# Patient Record
Sex: Female | Born: 1965 | Race: White | Hispanic: No | Marital: Married | Smoking: Never smoker
Health system: Southern US, Community
[De-identification: ages and names within clinical notes are randomized; demographics above are authoritative.]

## PROBLEM LIST (undated history)

## (undated) DIAGNOSIS — E7212 Methylenetetrahydrofolate reductase deficiency: Secondary | ICD-10-CM

## (undated) DIAGNOSIS — R011 Cardiac murmur, unspecified: Secondary | ICD-10-CM

## (undated) DIAGNOSIS — R112 Nausea with vomiting, unspecified: Secondary | ICD-10-CM

## (undated) DIAGNOSIS — K219 Gastro-esophageal reflux disease without esophagitis: Secondary | ICD-10-CM

## (undated) DIAGNOSIS — Z9889 Other specified postprocedural states: Secondary | ICD-10-CM

## (undated) DIAGNOSIS — I6529 Occlusion and stenosis of unspecified carotid artery: Secondary | ICD-10-CM

## (undated) DIAGNOSIS — G7102 Facioscapulohumeral muscular dystrophy: Secondary | ICD-10-CM

## (undated) DIAGNOSIS — F329 Major depressive disorder, single episode, unspecified: Secondary | ICD-10-CM

## (undated) DIAGNOSIS — G43909 Migraine, unspecified, not intractable, without status migrainosus: Secondary | ICD-10-CM

## (undated) DIAGNOSIS — Z86718 Personal history of other venous thrombosis and embolism: Secondary | ICD-10-CM

## (undated) DIAGNOSIS — F32A Depression, unspecified: Secondary | ICD-10-CM

## (undated) HISTORY — DX: Facioscapulohumeral muscular dystrophy: G71.02

## (undated) HISTORY — PX: FOOT SURGERY: SHX648

## (undated) HISTORY — DX: Cardiac murmur, unspecified: R01.1

## (undated) HISTORY — PX: ABDOMINAL HYSTERECTOMY: SHX81

## (undated) HISTORY — DX: Occlusion and stenosis of unspecified carotid artery: I65.29

## (undated) HISTORY — PX: WRIST SURGERY: SHX841

## (undated) HISTORY — PX: BACK SURGERY: SHX140

## (undated) HISTORY — DX: Methylenetetrahydrofolate reductase deficiency: E72.12

---

## 2000-01-02 ENCOUNTER — Other Ambulatory Visit: Admission: RE | Admit: 2000-01-02 | Discharge: 2000-01-02 | Payer: Self-pay | Admitting: Family Medicine

## 2000-01-25 ENCOUNTER — Encounter: Payer: Self-pay | Admitting: Family Medicine

## 2000-01-25 ENCOUNTER — Encounter: Admission: RE | Admit: 2000-01-25 | Discharge: 2000-01-25 | Payer: Self-pay | Admitting: Family Medicine

## 2000-07-27 ENCOUNTER — Ambulatory Visit (HOSPITAL_BASED_OUTPATIENT_CLINIC_OR_DEPARTMENT_OTHER): Admission: RE | Admit: 2000-07-27 | Discharge: 2000-07-27 | Payer: Self-pay | Admitting: Orthopedic Surgery

## 2001-02-04 ENCOUNTER — Other Ambulatory Visit: Admission: RE | Admit: 2001-02-04 | Discharge: 2001-02-04 | Payer: Self-pay | Admitting: Family Medicine

## 2001-02-11 ENCOUNTER — Encounter: Admission: RE | Admit: 2001-02-11 | Discharge: 2001-02-11 | Payer: Self-pay | Admitting: Family Medicine

## 2001-02-11 ENCOUNTER — Encounter: Payer: Self-pay | Admitting: Family Medicine

## 2002-05-20 ENCOUNTER — Other Ambulatory Visit: Admission: RE | Admit: 2002-05-20 | Discharge: 2002-05-20 | Payer: Self-pay | Admitting: Family Medicine

## 2002-06-17 ENCOUNTER — Encounter: Payer: Self-pay | Admitting: Family Medicine

## 2002-06-17 ENCOUNTER — Encounter: Admission: RE | Admit: 2002-06-17 | Discharge: 2002-06-17 | Payer: Self-pay | Admitting: Family Medicine

## 2002-10-20 ENCOUNTER — Observation Stay (HOSPITAL_COMMUNITY): Admission: RE | Admit: 2002-10-20 | Discharge: 2002-10-21 | Payer: Self-pay | Admitting: Obstetrics and Gynecology

## 2002-10-20 ENCOUNTER — Encounter (INDEPENDENT_AMBULATORY_CARE_PROVIDER_SITE_OTHER): Payer: Self-pay | Admitting: *Deleted

## 2004-07-11 ENCOUNTER — Other Ambulatory Visit: Admission: RE | Admit: 2004-07-11 | Discharge: 2004-07-11 | Payer: Self-pay | Admitting: Obstetrics and Gynecology

## 2005-05-02 ENCOUNTER — Ambulatory Visit (HOSPITAL_COMMUNITY): Admission: RE | Admit: 2005-05-02 | Discharge: 2005-05-02 | Payer: Self-pay | Admitting: Gastroenterology

## 2006-05-22 ENCOUNTER — Encounter: Admission: RE | Admit: 2006-05-22 | Discharge: 2006-08-20 | Payer: Self-pay | Admitting: Psychology

## 2008-06-06 ENCOUNTER — Inpatient Hospital Stay (HOSPITAL_COMMUNITY): Admission: EM | Admit: 2008-06-06 | Discharge: 2008-06-07 | Payer: Self-pay | Admitting: Emergency Medicine

## 2008-06-06 ENCOUNTER — Encounter: Payer: Self-pay | Admitting: Surgery

## 2008-06-06 ENCOUNTER — Ambulatory Visit: Payer: Self-pay | Admitting: Surgery

## 2008-06-22 ENCOUNTER — Ambulatory Visit: Payer: Self-pay | Admitting: Surgery

## 2009-01-18 ENCOUNTER — Ambulatory Visit: Payer: Self-pay | Admitting: Surgery

## 2009-01-20 ENCOUNTER — Ambulatory Visit: Payer: Self-pay | Admitting: Oncology

## 2009-01-26 LAB — CBC WITH DIFFERENTIAL (CANCER CENTER ONLY)
BASO#: 0 10*3/uL (ref 0.0–0.2)
Eosinophils Absolute: 0.2 10*3/uL (ref 0.0–0.5)
HGB: 12.8 g/dL (ref 11.6–15.9)
LYMPH#: 1.1 10*3/uL (ref 0.9–3.3)
NEUT#: 2 10*3/uL (ref 1.5–6.5)
Platelets: 294 10*3/uL (ref 145–400)
RBC: 3.98 10*6/uL (ref 3.70–5.32)
WBC: 3.6 10*3/uL — ABNORMAL LOW (ref 3.9–10.0)

## 2009-01-26 LAB — CMP (CANCER CENTER ONLY)
CO2: 31 mEq/L (ref 18–33)
Calcium: 9.2 mg/dL (ref 8.0–10.3)
Glucose, Bld: 81 mg/dL (ref 73–118)
Sodium: 140 mEq/L (ref 128–145)
Total Bilirubin: 0.3 mg/dl (ref 0.20–1.60)
Total Protein: 7.9 g/dL (ref 6.4–8.1)

## 2009-01-29 LAB — HYPERCOAGULABLE PANEL, COMPREHENSIVE RET.
AntiThromb III Func: 108 % (ref 76–126)
Anticardiolipin IgG: 4 GPL U/mL (ref <10)
Beta-2 Glyco I IgG: 6 U/mL (ref <=15)
Beta-2-Glycoprotein I IgA: <3 U/mL (ref <=15)
Beta-2-Glycoprotein I IgM: 10 U/mL (ref <=15)
DRVVT: 54.5 secs — ABNORMAL HIGH (ref 34.7–40.5)
Homocysteine: 6.9 umol/L (ref 4.0–15.4)
PTT Lupus Anticoagulant: 53.9 secs — ABNORMAL HIGH (ref 32.0–43.4)
Protein S Ag, Total: 62 % — ABNORMAL LOW (ref 70–140)

## 2009-02-18 ENCOUNTER — Ambulatory Visit: Payer: Self-pay | Admitting: Oncology

## 2009-02-23 LAB — PROTIME-INR (CHCC SATELLITE): INR: 2 (ref 2.0–3.5)

## 2009-03-31 ENCOUNTER — Ambulatory Visit: Payer: Self-pay | Admitting: Oncology

## 2009-04-08 LAB — PROTEIN C, TOTAL: Protein C, Total: 119 % (ref 70–140)

## 2009-04-08 LAB — PROTEIN C ACTIVITY: Protein C Activity: 200 % — ABNORMAL HIGH (ref 75–133)

## 2009-07-09 ENCOUNTER — Ambulatory Visit: Payer: Self-pay | Admitting: Oncology

## 2009-07-17 LAB — HYPERCOAGULABLE PANEL, COMPREHENSIVE
Anticardiolipin IgA: 0 APL U/mL (ref <22)
Anticardiolipin IgM: 0 MPL U/mL (ref <11)
Beta-2-Glycoprotein I IgM: 6 M Units (ref <20)
DRVVT: 39.9 secs (ref 36.2–44.3)
Protein C Activity: 178 % — ABNORMAL HIGH (ref 75–133)
Protein C, Total: 133 % (ref 70–140)
Protein S Activity: 76 % (ref 69–129)

## 2009-07-20 ENCOUNTER — Ambulatory Visit: Payer: Self-pay | Admitting: Vascular Surgery

## 2010-03-28 ENCOUNTER — Encounter: Admission: RE | Admit: 2010-03-28 | Discharge: 2010-03-28 | Payer: Self-pay | Admitting: Advanced Practice Midwife

## 2010-08-09 LAB — BASIC METABOLIC PANEL
BUN: 8 mg/dL (ref 6–23)
CO2: 27 mEq/L (ref 19–32)
Calcium: 8.4 mg/dL (ref 8.4–10.5)
Chloride: 95 mEq/L — ABNORMAL LOW (ref 96–112)
Creatinine, Ser: 0.81 mg/dL (ref 0.4–1.2)
GFR calc Af Amer: >60 mL/min (ref >60)
GFR calc non Af Amer: >60 mL/min (ref >60)
GFR calc non Af Amer: >60 mL/min (ref >60)
Glucose, Bld: 82 mg/dL (ref 70–99)
Potassium: 4 mEq/L (ref 3.5–5.1)
Potassium: 4.2 mEq/L (ref 3.5–5.1)
Sodium: 128 mEq/L — ABNORMAL LOW (ref 135–145)
Sodium: 134 mEq/L — ABNORMAL LOW (ref 135–145)

## 2010-08-09 LAB — POCT I-STAT, CHEM 8
Creatinine, Ser: 0.9 mg/dL (ref 0.4–1.2)
HCT: 39 % (ref 36.0–46.0)
Hemoglobin: 13.3 g/dL (ref 12.0–15.0)
Potassium: 3.9 mEq/L (ref 3.5–5.1)
Sodium: 131 mEq/L — ABNORMAL LOW (ref 135–145)

## 2010-08-09 LAB — APTT: aPTT: 29 seconds (ref 24–37)

## 2010-08-09 LAB — DIFFERENTIAL
Basophils Absolute: 0 10*3/uL (ref 0.0–0.1)
Basophils Relative: 0 % (ref 0–1)
Eosinophils Absolute: 0.2 10*3/uL (ref 0.0–0.7)
Eosinophils Relative: 5 % (ref 0–5)
Lymphocytes Relative: 28 % (ref 12–46)
Lymphs Abs: 1.5 10*3/uL (ref 0.7–4.0)
Monocytes Absolute: 0.4 10*3/uL (ref 0.1–1.0)
Monocytes Relative: 8 % (ref 3–12)
Neutro Abs: 3.3 10*3/uL (ref 1.7–7.7)
Neutrophils Relative %: 60 % (ref 43–77)

## 2010-08-09 LAB — LIPID PANEL
Cholesterol: 133 mg/dL (ref 0–200)
HDL: 34 mg/dL — ABNORMAL LOW (ref >39)
HDL: 38 mg/dL — ABNORMAL LOW (ref >39)
LDL Cholesterol: 88 mg/dL (ref 0–99)
Triglycerides: 37 mg/dL (ref <150)

## 2010-08-09 LAB — CBC
HCT: 35.2 % — ABNORMAL LOW (ref 36.0–46.0)
Hemoglobin: 12.1 g/dL (ref 12.0–15.0)
MCHC: 34.4 g/dL (ref 30.0–36.0)
MCHC: 34.4 g/dL (ref 30.0–36.0)
MCV: 96.7 fL (ref 78.0–100.0)
MCV: 97.2 fL (ref 78.0–100.0)
Platelets: 282 10*3/uL (ref 150–400)
Platelets: 324 10*3/uL (ref 150–400)
RBC: 3.62 MIL/uL — ABNORMAL LOW (ref 3.87–5.11)
RDW: 13.2 % (ref 11.5–15.5)
WBC: 5.6 10*3/uL (ref 4.0–10.5)

## 2010-08-09 LAB — HEPARIN LEVEL (UNFRACTIONATED): Heparin Unfractionated: 0.22 IU/mL — ABNORMAL LOW (ref 0.30–0.70)

## 2010-08-09 LAB — PROTIME-INR
INR: 1 (ref 0.00–1.49)
Prothrombin Time: 13.7 seconds (ref 11.6–15.2)

## 2010-09-06 NOTE — Assessment & Plan Note (Signed)
OFFICE VISIT   JAHNI, PAUL  DOB:  Mar 29, 1966                                       07/20/2009  NFAOZ#:[id number]   The patient returns to clinic today for evaluation of a right thumb  embolism.  She was last seen by Dr. Myra Gianotti 01/18/2009.  At that time,  she was doing remarkably well and her thumb had improved and was back to  baseline.  She returns to clinic today complaining of some right upper  extremity numbness and prickling sensation on her face.  She also stated  that her Coumadin had been stopped in December with a decline in her  condition, returning more towards baseline of forgetfulness and  tiredness, memory loss and numbness.  She is attributing this to the  discontinuation of the Coumadin and we are asked to comment on this.   PAST MEDICAL HISTORY:  Significant for an extensive history of  migraines.   REVIEW OF SYSTEMS:  Except for the aforementioned history of worsening  of memory problems, numbness in the right upper extremity and prickly  sensation on face and tiredness, was entirely negative.   PHYSICAL FINDINGS:  Blood pressure was 126/79, heart rate 72, blood  pressure on the left was 137/80.  She appeared to be well-nourished and  in no distress.  She did appear somewhat anxious.  HEENT:  PERRLA, EOMI.  Mucous membranes were pink and moist.  Cardiac exam revealed a regular  rate and rhythm.  The abdomen was soft, nontender.  There were no major  deformities of her musculoskeletal system.  I did evaluate the right  thumb by Doppler.  Her thenar pulse was present.   Due to her complaint of numbness in her right upper extremity, I did  elect to obtain a carotid ultrasound which was significant for a 1%-39%  narrowing of her left internal carotid artery.   ASSESSMENT/PLAN:  The patient today seems to be more desirous of  reinitiating Coumadin as she sees this as a precipitating event for the  worsening of her memory loss and  symptomatology.  She has seen Dr. Clelia Croft  and also Dr. Welton Flakes.  Dr. Park Breed has been in contact with Dr. Myra Gianotti  concerning this.  I did speak with Dr. Myra Gianotti by phone, and he is in  agreement that since she her symptoms did remarkably improve  and her  quality of life has improved on Coumadin, that the it would be feasible  to resume Coumadin at a low dose.   The patient does understand the long-term effects, risks and benefits of  Coumadin therapy.  With this in mind, we would have no problem with the  reinstitution of Coumadin therapy and long-term continuation provided  there was a benefit to the patient.   Wilmon Arms, PA   Janetta Hora. Fields, MD  Electronically Signed   KEL/MEDQ  D:  07/20/2009  T:  07/21/2009  Job:  [id number]   cc:   Lupita Raider, M.D.  Dr. Welton Flakes

## 2010-09-06 NOTE — Assessment & Plan Note (Signed)
OFFICE VISIT   Julie Stone, Julie Stone  DOB:  09-Nov-1965                                       06/22/2008  PPIRJ#:[id number]   REASON FOR VISIT:  Follow up.   HISTORY:  This a 45 year old female who presented to the emergency  department on June 06, 2008, with a blue right thumb.  This was not  associated with any trauma.  She does not have atrial fibrillation.  Her  only medical history is that of migraine headaches with aura.  Her aura  consisted of stroke-like symptoms and passing out.  I admitted the  patient over night and placed her on heparin.  She had undergone a CT  angiogram which revealed no etiology for her embolic process.  She also  underwent an arterial ultrasound which had digital pressures consistent  with embolic disease.  She was placed on heparin overnight and she did  improve.  Coloring came back into her finger.  For that reason, I  elected to discharge her home on Coumadin.  Dr. Arvilla Market is graciously  following her Coumadin levels.  She states today that her finger has  continued to improve.  The discoloration has nearly resolved and she has  normal function of her thumb.   PHYSICAL EXAMINATION:  Vital Signs:  Blood pressure 118/74, pulse is 83.  She is well-appearing, in no distress.  Cardiovascular:  Regular rate  and rhythm, respirations are nonlabored.  She has a palpable pulse in  her right arm, the thumb is nearly of normal color.   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   Plan:  Given that the patient had such a dramatic response from heparin  and Coumadin, I would recommend continuing this for at least 6 months.  She has undergone a rather extensive workup.  A CT angiogram was  negative.  Cardiac echocardiogram was negative.  I think that after 6  months of anticoagulation we can safely stop this and then perform a  hypercoagulable blood panel and make recommendations for long-term  anticoagulation based on  that.  Hopefully we can get away without long-  term coagulation.  She has also had a cholesterol panel checked, her LDL  was 83.  She had been on estrogen and this has subsequently been  stopped.  I will see her back in 6 months.  She will call if she has any  further questions.   Jorge Ny, MD  Electronically Signed   VWB/MEDQ  D:  06/22/2008  T:  06/23/2008  Job:  [id number]   cc:   Donia Guiles, M.D.

## 2010-09-06 NOTE — Assessment & Plan Note (Signed)
OFFICE VISIT   DRESDEN, AMENT  DOB:  1965/07/13                                       01/18/2009  OACZY#:[id number]   REASON FOR VISIT:  Follow up, right thumb embolism.   HISTORY:  This is a 45 year old female that I initially saw in February,  2010 in the emergency department, where she had presented with a painful  right blue thumb.  This was not associated with trauma.  She did not  have an irregular heartbeat.  She only had a history of migraines with  aura, which consisted of stroke-like symptoms and syncope.  A CT scan  was performed, which did not show any etiology for her embolic process.  Arterial ultrasound was performed which revealed digital pressures  consistent with embolic disease.  She had an echocardiogram, which has  been negative.  She was placed on heparin overnight in the hospital and  had significant improvement; therefore, I discharged her on Coumadin.  She has been on Coumadin since that time.  She has had a dramatic  improvement.  She no longer complains of any pain in her thumb.   PHYSICAL EXAMINATION:  Her blood pressure is 149/89.  Pulse is 69.  She  is well-appearing in no distress.  She has a palpable radial pulse.  She  has normal function and coloration of her right thumb.   ASSESSMENT:  Right thumb embolism.   PLAN:  The patient's extensive workup has been negative to date.  I had  initially planned to stop her Coumadin at this point time; however, she  has had such a dramatic improvement in her quality of life for the past  6 months, I am a little reluctant to do that.  For that reason, I am  going to send her to hematology, where she can get a full workup and  have them assist with the decision for duration of Coumadin therapy.  I  will plan on seeing her back in 6 months.   Jorge Ny, MD  Electronically Signed   VWB/MEDQ  D:  01/18/2009  T:  01/19/2009  Job:  [id number]

## 2010-09-06 NOTE — Procedures (Signed)
CAROTID DUPLEX EXAM   INDICATION:  Right upper extremity numbness, prickling sensation on  face.   HISTORY:  Diabetes:  No.  Cardiac:  No.  Hypertension:  No.  Smoking:  No.  Previous Surgery:  No.  CV History:  Complaint of tiredness, memory loss, numbness in the right  upper extremity, and prickling facial sensations.  Amaurosis Fugax No, Paresthesias No, Hemiparesis No.                                       RIGHT             LEFT  Brachial systolic pressure:         126               137  Brachial Doppler waveforms:         Normal            Normal  Vertebral direction of flow:        Antegrade         Antegrade  DUPLEX VELOCITIES (cm/sec)  CCA peak systolic                   75                75  ECA peak systolic                   78                92  ICA peak systolic                   110               75  ICA end diastolic                   49                32  PLAQUE MORPHOLOGY:                                    Homogenous  PLAQUE AMOUNT:                      None              Minimal  PLAQUE LOCATION:                                      Proximal ICA   IMPRESSION:  1. No evidence of right internal carotid artery stenosis.  2. 1-39% stenosis of the left internal carotid artery.   ___________________________________________  V. Charlena Cross, MD   CH/MEDQ  D:  07/21/2009  T:  07/21/2009  Job:  [id number]

## 2010-09-09 NOTE — Op Note (Signed)
Julie Stone, Julie Stone NO.:  [id number]   MEDICAL RECORD NO.:  [id number]          PATIENT TYPE:  AMB   LOCATION:  ENDO                         FACILITY:  [facility]   PHYSICIAN:  Julie Stone, M.D.   DATE OF BIRTH:  Feb 12, 1966   DATE OF PROCEDURE:  05/02/2004  DATE OF DISCHARGE:                                 OPERATIVE REPORT   REFERRING PHYSICIAN:  Dr. Donia Stone   PROCEDURE:  Esophagogastroduodenoscopy.   PROCEDURE INDICATION:  Ms. Julie Stone is a 45 year old female, born  [birth year].  Ms. Julie Stone has chronic intermittent solid food dysphagia,  unassociated with odynophagia or chronic heartburn.  She reports no weight  loss.   MEDICATION ALLERGIES:  DARVON and SULFA.   CHRONIC MEDICATIONS:  Lexapro, Keppra, Maxalt, Celebrex, Reglan, Estratest,  Toradol, Thorazine, Lamictal, Relpax, Vivactil, Prilosec, Phenergan,  Sudafed, ketoprofen, p.r.n. minocycline.   PAST MEDICAL HISTORY:  1.  TAH/BSO.  2.  Wrist surgery.  3.  Migraine headaches.  4.  Depression.  5.  Hypertension.  6.  Hyperlipidemia.   HABITS:  Ms. Julie Stone does not use tobacco products and consumes alcohol in  moderation.   FAMILY HISTORY:  Negative for colon cancer.   ENDOSCOPIST:  Dr. Reece Stone   PREMEDICATION:  1.  Versed 7 mg.  2.  Demerol 50 mg.   PROCEDURE:  After obtaining informed consent, Julie Stone was placed in the  left lateral decubitus position.  I administered intravenous Demerol and  intravenous Versed to achieve conscious sedation for the procedure.  The  patient's blood pressure, oxygen saturation, and cardiac rhythm were  monitored throughout the procedure and documented in the medical record.   The Olympus gastroscope was passed through the posterior hypopharynx in the  proximal esophagus without difficulty.  The hypopharynx, larynx, and vocal  cords appeared normal.   Esophagoscopy:  The proximal mid and lower segments of the esophageal mucosa  appear  completely normal.  The squamocolumnar junction and esophagogastric  junction are noted at 35 cm from the incisor teeth.  There is no endoscopic  evidence for the presence of esophageal stricture formation, Barrett's  esophagus, erosive esophagitis, or esophageal obstruction.   Gastroscopy:  Retroflexed view of the gastric cardia and fundus was normal.  The diaphragmatic hiatus is patulous.  The gastric body, antrum, and pylorus  appeared normal.   Duodenoscopy:  The duodenal bulb, second portion of duodenum, and third  portion of duodenum appeared normal.   ASSESSMENT:  Normal esophagogastroduodenoscopy.   RECOMMENDATIONS:  Continue Prilosec to prevent gastroesophageal reflux.  Use  p.r.n. sublingual hyoscyamine at the onset of dysphagia.           ______________________________  Julie Stone, M.D.     MJ/MEDQ  D:  05/02/2005  T:  05/02/2005  Job:  [id number]   cc:   Julie Stone, M.D.  Fax: [phone]

## 2010-09-09 NOTE — H&P (Signed)
NAME:  Julie Stone, Julie Stone                       ACCOUNT NO.:  [id number]   MEDICAL RECORD NO.:  [id number]                   PATIENT TYPE:  AMB   LOCATION:  SDC                                  FACILITY:  [facility]   PHYSICIAN:  Duke Salvia. Marcelle Overlie, M.D.            DATE OF BIRTH:  29-Sep-1965   DATE OF ADMISSION:  DATE OF DISCHARGE:                                HISTORY & PHYSICAL   HISTORY AND PHYSICAL:  Scheduled for surgery, October 20, 2002, [facility].   CHIEF COMPLAINT:  Slightly elevated CA125, endometrial polyp.   HISTORY AND PHYSICAL EXAM:  A 45 year old G3, P2.  Her husband has had a  vasectomy.  Till recently I had last seen her in 1999.  From 1999 until 2004  she has been under the care of several neurologists for problematic and  debilitating migraine headache.  The headache tends to be cyclic in nature  and she has been on a number of Zomig type medications and also trials of  estrogen manipulation without much improvement in her headache.  Additionally she has had some pelvic discomfort and heavier periods and was  worked up by Dr. Arvilla Market who obtained a CA125 that was slightly elevated at  34 and a thickened endometrium 1.2 cm suggestive of a polyp.  We performed  __________ in our office that showed a uterus 8 x 4.8 x 4.2.  Endometrium  was 14 mm.  Both ovaries were normal.  There was no free fluid.  There was a  definite well formed endometrial polyp within the cavity.  Discussed the  number of options including D&C, hysteroscopy or more definitive evaluation.  She is concerned about the elevation of her CA125 and states that her mother  went through early menopause and her preference especially given the  possible hormonal trigger between estrogen and her severe headache would  prefer LAVH-BSO.  This procedure including risk of bleeding, infection,  transfusion, adjacent organ injury, the possible need to complete surgery  abdominally were all reviewed.   Additionally, the need for some form of ERT  to help with vasomotor symptoms, to prevent osteoporosis etcetera at her age  were all discussed which she understands and accepts.  Her expected recovery  time, the risk regarding phlebitis, wound infection reviewed with her which  she understands and accepts.   PAST MEDICAL HISTORY:   ALLERGIES:  1. SULFA drugs.  2. DARVOCET.   PRIOR SURGERY:  Cesarean section x 2.   REVIEW OF SYSTEMS:  Significant for migraine headache.  GERD with reflux  treated in 1986.  Pregnancy related hemorrhoids.   FAMILY HISTORY:  Significant for her father having asthma.  Her grandfather  with a brain tumor.  Otherwise negative.   Her most recent Pap by Dr. Arvilla Market was class I.   PHYSICAL EXAMINATION:  VITAL SIGNS:  Temp 98.2, blood pressure 120/78.  HEENT:  Unremarkable.  NECK:  Supple without masses.  LUNGS:  Clear.  CARDIOVASCULAR:  Regular, rate and rhythm without murmurs, rubs or gallops.  BREASTS:  Without masses.  ABDOMEN:  Soft, flat, nontender.  PELVIC:  Normal external genitalia.  Vagina and cervix were clear.  Uterus  normal position, normal size.  Adnexa negative.   IMPRESSION:  1. Endometrial polyp.  2. Slightly elevated CA125.  3. History of severe migraine headache.   PLAN:  Please see discussion above.  After reviewing her options she prefers  to proceed with LAVH-BSO.  Procedure and risks reviewed as above.                                                  Richard M. Marcelle Overlie, M.D.    RMH/MEDQ  D:  10/17/2002  T:  10/18/2002  Job:  [id number]

## 2010-09-09 NOTE — Op Note (Signed)
[place]. [facility]  Patient:    Julie Stone, Julie Stone                    MRN: [id number] Proc. Date: 07/27/00 Adm. Date:  [id number] Attending:  Ronne Binning                           Operative Report  PREOPERATIVE DIAGNOSIS:  Wrist pain, right wrist.  POSTOPERATIVE DIAGNOSIS:  Wrist pain, right wrist, with lunotriquetral tear.  OPERATION:  Arthroscopy with debridement, right wrist.  SURGEON:  Nicki Reaper, M.D.  ANESTHESIA:  Axillary, general.  DATE OF OPERATION:  July 27, 2000  ANESTHESIOLOGIST:  Janetta Hora. Gelene Mink, M.D.  HISTORY:  The patient is a 45 year old female with a history of wrist pain. MRI is negative.  She is admitted for arthroscopy and debridement.  PROCEDURE:  The patient is brought to the operating room where an axillary block was carried out without difficulty.  She was prepped and draped using Betadine scrub and solution with the right arm free.  The limb was placed in the arthroscopy tower, she had feeling, general anesthetic was given.  The joint inflated to 3/4 portal.  A transverse incision was made, deepened with the hemostat.  Blunt trocar was used to enter the joint.  The joint was inspected.  The volar radial wrist ligaments were intact.  The articular surfaces were intact.  The scapholunate ligament was intact.  The ulnar side showed significant fibrillation about the lunotriquetral joint - both TFCC and lunotriquetral joint.  An irrigation catheter was placed in the 6U portal. The 5/6 portal was then opened.  A probe was entered.  The TFCC trampoline - she had a large prestyloid recess which was intact.  There was no instability of the TFCC.  The lunotriquetral tear was immediately apparent.  The scope was entered through 5/6, the LT tear inspected.  The LT tear was then debrided with a full radius shaver, alternating the scope and the shaver through the two portals.  An ArthroWand was then inserted, the capsular  shrinking performed around the remainder of the tendon, further debridement performed. The mid carpal joint was then inspected through the distal 3/4 portal.  The SCT joint showed no arthritic changes, there was no instability of the scapholunate or lunotriquetral.  The proximal capitate and hamate showed no articular surface damage.  The instruments were removed, the portals closed with interrupted 5-0 nylon suture.  Sterile compressive dressing and splint were applied.  The patient tolerated the procedure well and was taken to the recovery room for observation in satisfactory condition.  She is discharged home to return to [facility] in one week on Vicodin and Keflex. DD:  07/27/00 TD:  07/27/00 Job: [id number]

## 2010-09-09 NOTE — Discharge Summary (Signed)
   NAME:  Julie Stone, Julie Stone                       ACCOUNT NO.:  [id number]   MEDICAL RECORD NO.:  [id number]                   PATIENT TYPE:  OBV   LOCATION:  9114                                 FACILITY:  [facility]   PHYSICIAN:  Duke Salvia. Marcelle Overlie, M.D.            DATE OF BIRTH:  [birth year]   DATE OF ADMISSION:  10/20/2002  DATE OF DISCHARGE:  10/21/2002                                 DISCHARGE SUMMARY   DISCHARGE DIAGNOSES:  1. Abnormal uterine bleeding, endometrial polyp, elevated CA-125.  2. Laparoscopy-assisted vaginal hysterectomy/bilateral salpingo-oophorectomy     this admission.   For summary of the history and physical exam please see admission H&P for  details.  Briefly, a 45 year old G3 P2 who has had problems with severe  migraine headache and has also developed abnormal uterine bleeding with  ultrasound suggesting endometrial polyp and a mildly elevated CA-125  presents for definitive hysterectomy and BSO.   HOSPITAL COURSE:  On October 20, 2002 under general anesthesia the patient  underwent LAVH/BSO.  One small area of endometriosis was noted at the time.  No other complications were noted.  Her hemoglobin was checked that  afternoon because of some cuff oozing that was minimal at the end of the  surgery.  Preoperative hemoglobin was 12.1.  On June 28 afternoon it was  10.3 with stable vital signs.   The following a.m. her hemoglobin was 8.3.  Her diet was advanced and  catheter was removed.  The IV was removed.  She was watched for the better  of the morning and was discharged at 2 p.m.  At noon her hemoglobin was  stable at 8.4.  She remained afebrile, had showered, was ambulating without  difficulty, had established normal urinary function, and her abdominal exam  was unremarkable.   LABORATORY DATA:  Admission UA was negative.  CBC preoperative hemoglobin  12.1, wbc 6700.  On June 29 hemoglobin was 8.3 with a wbc of 8800.  UA was  negative except for moderate  leukocyte esterase, many epithelial cells.  Blood type was O positive.  Pathology is still pending.   DISPOSITION:  The patient is discharged on Tylox p.r.n. pain, Gynodiol 0.5  mg one p.o. daily, and Hemocyte once daily.  Will return to our office in  one week for a postoperative check.  Advised to report any increased vaginal  bleeding, fever of 101, increased pain or bleeding.  Was given specific  instructions regarding diet, sex, exercise.   CONDITION:  Good.   ACTIVITY:  Gradually increase.                                               Richard M. Marcelle Overlie, M.D.    RMH/MEDQ  D:  10/21/2002  T:  10/21/2002  Job:  [id number]

## 2010-09-09 NOTE — Op Note (Signed)
NAME:  Julie Stone, Julie Stone                       ACCOUNT NO.:  [id number]   MEDICAL RECORD NO.:  [id number]                   PATIENT TYPE:  OBV   LOCATION:  9399                                 FACILITY:  [facility]   PHYSICIAN:  Duke Salvia. Marcelle Overlie, M.D.            DATE OF BIRTH:  [birth year]   DATE OF PROCEDURE:  10/20/2002  DATE OF DISCHARGE:                                 OPERATIVE REPORT   PREOPERATIVE DIAGNOSES:  Menorrhagia, endometrial polyp, minimally elevated  CA125, pelvic pain.   POSTOPERATIVE DIAGNOSES:  Menorrhagia, endometrial polyp, minimally elevated  CA125, pelvic pain plus minimal endometriosis.   OPERATION/PROCEDURE:  LABH, BSO.   ASSISTANT:  Juluis Mire, M.D.   ANESTHESIA:  General endotracheal.   COMPLICATIONS:  None.   DRAINS:  Foley catheter.   ESTIMATED BLOOD LOSS:  250.   SPECIMENS REMOVED:  Uterus, tubes and ovaries.   PROCEDURE AND FINDINGS:  The patient was taken to the operating room.  After  adequate level of general endotracheal anesthesia was obtained, the  patient's legs were placed in the stirrups, the abdomen, peroneum and groin  were prepped and draped in the usual manner for a laparoscopy, D&C.  The  bladder was drained.  EUA was carried out.  The uterus appeared posterior,  normal size.  Adnexa negative.  The Hulka tenaculum was positioned for  posterior uterus.  Attention directed to the abdomen where a 2 cm  subumbilical incision was made, the Veress needle was introduced without  difficulty.  Its intra-abdominal position was verified by pressure and water  testing.  After a 2 L pneumoperitoneum was then created, the laparoscopic  trocar was inserted and then introduced without difficulty.  There was no  evidence of any bleeding or trauma.  Three fingerbreadths above the  symphysis in the midline, the second 5 mm trocar was inserted under direct  visualization.  The patient was then placed in Trendelenburg. With the  uterus anteflexed,  the pelvic findings were as follows:   The uterus itself was normal size.  There was some anterior serosal,  brownish endometriosis right at the tubal junction but the anterior bladder  flap area was otherwise unremarkable.  Minimal scarring after her prior  sections.  Both ovaries appeared to be unremarkable.  There were no cysts or  adhesions.  No other posterior cul-de-sac endometriosis was noted.  The  upper abdomen was otherwise unremarkable.  With the grasping forceps below,  the right tube and ovary were tented toward the midline with the uterus  anteflexed.  The course of the right pelvic ureter was identified.  It  crossed the pelvic brim.  The right IP ligament was then isolated.  It was  well above the area of the ureter.  It was coagulated and cut down to and  including the round ligament.  The exact same was repeated on the opposite  side, again after carefully identifying the course of  the ureter below.  After this was completed, the vaginal portion of the procedure was started.  The legs were extended.  The cervix grasped with a tenaculum.  The cervical  vaginal mucosa was incised.  Posterior culdotomy performed.  The peritoneum  was sutured to the vaginal cuff just to hold this layer temporarily.  The  bladder was advanced superiorly with sharp and blunt dissection.  The  uterosacral ligament was then clamped, divided and suture ligated on either  side in a sequential manner, staying close to the uterus, then the cardinal  ligament was clamped, divided and suture ligated.  The bladder was advanced  superiorly until peritoneal reflection could be identified.  This was  entered sharply and a retractor used to gently elevate the bladder out of  the field.  The remaining upper broad ligament, the remaining uterine  artery, pedicle and upper broad ligament pedicles were clamped, divided and  suture ligated with 0 Dexon.  The fundus of the uterus was then delivered  posteriorly.   The remaining pedicles were clamped, divided and free tied.  The specimen was thus removed.  The cuff was closed with a running locked 2-  0 Dexon, 2-0 Monocryl suture.  Inspection of all major pedicles at that  point revealed them to be hemostatic.  Prior to closure, sponge and  instrument counts were reported as correct x2.  Because the culdoplasty  suture was in place from the left uterosacral ligament, picking up the  posterior peritoneum across her right uterosacral ligament which was then  tied down.  The cuff was closed right to left with figure-of-eight 2-0  Monocryl sutures.  Clear urine was noted at that point.  The abdomen was  then re-insufflated, irrigated.  All pedicles were inspected and noted to be  hemostatic.  Intercede was placed across the vaginal stump to help with  adhesion prevention.  Hemostasis had been checked with the pressure lowered.  After this was completed, the instruments were removed, gas allowed to  escape.  The incision was closed with 4-0 Dexon subcuticular sutures and  Dermabond.  She tolerated this well, went to the recovery room in good  condition.                                                Richard M. Marcelle Overlie, M.D.    [facility]/MEDQ  D:  10/20/2002  T:  10/20/2002  Job:  [id number]

## 2010-09-24 ENCOUNTER — Emergency Department (HOSPITAL_COMMUNITY)
Admission: EM | Admit: 2010-09-24 | Discharge: 2010-09-24 | Disposition: A | Discharge status: Home / Self Care | Payer: BC Managed Care – PPO | Attending: Emergency Medicine | Admitting: Emergency Medicine

## 2010-09-24 ENCOUNTER — Emergency Department (HOSPITAL_COMMUNITY): Payer: BC Managed Care – PPO

## 2010-09-24 DIAGNOSIS — R1011 Right upper quadrant pain: Secondary | ICD-10-CM | POA: Insufficient documentation

## 2010-09-24 DIAGNOSIS — G43909 Migraine, unspecified, not intractable, without status migrainosus: Secondary | ICD-10-CM | POA: Insufficient documentation

## 2010-09-24 LAB — COMPREHENSIVE METABOLIC PANEL
ALT: 28 U/L (ref 0–35)
Calcium: 9 mg/dL (ref 8.4–10.5)
Creatinine, Ser: 0.66 mg/dL (ref 0.4–1.2)
GFR calc Af Amer: >60 mL/min (ref >60)
Glucose, Bld: 82 mg/dL (ref 70–99)
Sodium: 130 mEq/L — ABNORMAL LOW (ref 135–145)
Total Protein: 7.3 g/dL (ref 6.0–8.3)

## 2010-09-24 LAB — CBC
HCT: 33.9 % — ABNORMAL LOW (ref 36.0–46.0)
MCH: 31.8 pg (ref 26.0–34.0)
MCHC: 34.5 g/dL (ref 30.0–36.0)
RDW: 12.7 % (ref 11.5–15.5)

## 2010-09-24 LAB — URINALYSIS, ROUTINE W REFLEX MICROSCOPIC
Bilirubin Urine: NEGATIVE
Ketones, ur: 15 mg/dL — AB
Nitrite: NEGATIVE
Protein, ur: NEGATIVE mg/dL
Urobilinogen, UA: 0.2 mg/dL (ref 0.0–1.0)
pH: 7.5 (ref 5.0–8.0)

## 2010-09-24 LAB — PROTIME-INR: INR: 1.86 — ABNORMAL HIGH (ref 0.00–1.49)

## 2010-09-24 LAB — DIFFERENTIAL
Basophils Absolute: 0 10*3/uL (ref 0.0–0.1)
Basophils Relative: 1 % (ref 0–1)
Eosinophils Relative: 4 % (ref 0–5)
Monocytes Absolute: 0.4 10*3/uL (ref 0.1–1.0)

## 2010-09-24 LAB — POCT PREGNANCY, URINE: Preg Test, Ur: NEGATIVE

## 2010-09-24 LAB — LIPASE, BLOOD: Lipase: 21 U/L (ref 11–59)

## 2010-09-28 NOTE — Consult Note (Signed)
Julie Stone, Julie Stone             ACCOUNT NO.:  [id number]  MEDICAL RECORD NO.:  [id number]           PATIENT TYPE:  E  LOCATION:  [facility]                         FACILITY:  [facility]  PHYSICIAN:  Adolph Pollack, M.D.DATE OF BIRTH:  [birth year]  DATE OF CONSULTATION:  09/24/2010 DATE OF DISCHARGE:  09/24/2010                                CONSULTATION   REQUESTING PHYSICIAN:  Orlene Och, MD  REASON:  Abdominal pain evaluate for appendicitis.  HISTORY:  Ms. Julie Stone is a 45 year old female who woke this morning and had the abrupt onset of sharp periumbilical pain.  This was a constant pain in the background but the sharp pain came in waves doubling her over at times.  No nausea, vomiting, fever, chills or diarrhea.  These episodes persisted and she [facility] and was transferred over to the emergency department for further evaluation. Lab work and a CT scan was performed and we were asked to see her to evaluate for possible appendicitis.  She came to the emergency room at approximately mid day and currently she says she is pain free and feeling much better.  PAST MEDICAL HISTORY: 1. Migraine headaches. 2. Clotting disorder the name of which she does not remember. 3. Peptic ulcer disease. 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 5. Depression.  ALLERGIES:  SULFA DRUGS and DARVOCET.  MEDICATIONS:  Coumadin, Effexor, Flonase, Keppra, Nasonex, Phenergan, Thorazine, Wellbutrin, Zanaflex.  SOCIAL HISTORY:  She is married, has two daughters.  She denies current tobacco use, occasionally has an alcoholic beverage.  REVIEW OF SYSTEMS:  CARDIOVASCULAR:  She has a heart murmur but no known coronary artery disease or hypertension.  PULMONARY:  No asthma, pneumonia.  GI:  No hepatitis, diverticulitis.  GU:  No dysuria, hematuria or kidney stones.  ENDOCRINE:  No diabetes or hypercholesterolemia.  PHYSICAL EXAMINATION:  GENERAL:  A  well-developed, well-nourished female.  She is in no acute distress, pleasant and cooperative, sitting up in a stretcher. VITAL SIGNS:  Temperature is 98.3, blood pressure is 128/70, pulse 72, respiratory rate 16. NECK:  Supple without mass. RESPIRATORY:  Breath sounds are equal, clear respirations, nonlabored. CARDIOVASCULAR:  Demonstrates a regular rate, regular rhythm, I hear no murmur. ABDOMEN:  Soft, nontender, nondistended.  No masses.  No hernias.  Well- healed subumbilical and lower transverse scars were present. MUSCULOSKELETAL:  No edema.  LABORATORY DATA:  CBC demonstrates a hemoglobin of 11.7, white cell count of 6000, with no left shift and platelet count 282,000. Electrolytes demonstrate a sodium of 130, otherwise, within normal limits.  Liver function tests not elevated.  Lipase 21.  Urinalysis negative.  CT scan demonstrates findings consistent with some constipation but no acute inflammatory changes.  IMPRESSION:  Acute abdominal pain that has resolved.  She states she has not been constipated recently in fact had a normal bowel movement yesterday.  There was no obvious infectious or inflammatory etiology thus I doubt appendicitis.  PLAN:  I gave the options of admission for overnight observation or being discharged to home and returning if the symptoms return.  She preferred to be discharged to home.  I told to stay on sips of liquids tonight and advance herself to clear liquid diet on Sunday and on Monday she can start some bland foods.  She seems to understand this.     Adolph Pollack, M.D.     Kari Baars  D:  09/24/2010  T:  09/25/2010  Job:  [id number]  Electronically Signed by Avel Peace M.D. on 09/28/2010 07:56:30 AM

## 2010-11-24 ENCOUNTER — Other Ambulatory Visit: Payer: Self-pay | Admitting: Oncology

## 2010-11-24 DIAGNOSIS — Z7901 Long term (current) use of anticoagulants: Secondary | ICD-10-CM

## 2010-11-24 DIAGNOSIS — D6859 Other primary thrombophilia: Secondary | ICD-10-CM

## 2010-11-24 DIAGNOSIS — Z86718 Personal history of other venous thrombosis and embolism: Secondary | ICD-10-CM

## 2010-11-24 LAB — CBC WITH DIFFERENTIAL (CANCER CENTER ONLY)
BASO#: 0 10*3/uL (ref 0.0–0.2)
EOS%: 7.9 % — ABNORMAL HIGH (ref 0.0–7.0)
Eosinophils Absolute: 0.3 10*3/uL (ref 0.0–0.5)
HCT: 32.7 % — ABNORMAL LOW (ref 34.8–46.6)
HGB: 11.6 g/dL (ref 11.6–15.9)
LYMPH#: 1.3 10*3/uL (ref 0.9–3.3)
MCH: 32.8 pg (ref 26.0–34.0)
MCHC: 35.5 g/dL (ref 32.0–36.0)
MONO%: 9.6 % (ref 0.0–13.0)
NEUT#: 2.2 10*3/uL (ref 1.5–6.5)
NEUT%: 50.6 % (ref 39.6–80.0)
RBC: 3.54 10*6/uL — ABNORMAL LOW (ref 3.70–5.32)

## 2010-11-24 LAB — PROTIME-INR (CHCC SATELLITE)

## 2010-11-25 LAB — COMPREHENSIVE METABOLIC PANEL
ALT: 17 U/L (ref 0–35)
Albumin: 4.2 g/dL (ref 3.5–5.2)
CO2: 25 mEq/L (ref 19–32)
Chloride: 96 mEq/L (ref 96–112)
Glucose, Bld: 73 mg/dL (ref 70–99)
Potassium: 4.1 mEq/L (ref 3.5–5.3)
Sodium: 130 mEq/L — ABNORMAL LOW (ref 135–145)
Total Protein: 6.9 g/dL (ref 6.0–8.3)

## 2010-11-25 LAB — IRON AND TIBC
Iron: 77 ug/dL (ref 42–145)
UIBC: 226 ug/dL

## 2010-11-25 LAB — RETICULOCYTES (CHCC): ABS Retic: 44.3 10*3/uL (ref 19.0–186.0)

## 2010-11-25 LAB — FERRITIN: Ferritin: 67 ng/mL (ref 10–291)

## 2011-02-09 ENCOUNTER — Other Ambulatory Visit: Payer: Self-pay | Admitting: Family Medicine

## 2011-02-09 DIAGNOSIS — E049 Nontoxic goiter, unspecified: Secondary | ICD-10-CM

## 2011-02-13 ENCOUNTER — Ambulatory Visit
Admission: RE | Admit: 2011-02-13 | Discharge: 2011-02-13 | Disposition: A | Discharge status: Home / Self Care | Payer: BC Managed Care – PPO | Source: Ambulatory Visit | Attending: Family Medicine | Admitting: Family Medicine

## 2011-02-13 DIAGNOSIS — E049 Nontoxic goiter, unspecified: Secondary | ICD-10-CM

## 2011-07-03 ENCOUNTER — Other Ambulatory Visit (INDEPENDENT_AMBULATORY_CARE_PROVIDER_SITE_OTHER): Payer: BC Managed Care – PPO | Admitting: *Deleted

## 2011-07-03 DIAGNOSIS — I6529 Occlusion and stenosis of unspecified carotid artery: Secondary | ICD-10-CM

## 2011-07-12 ENCOUNTER — Other Ambulatory Visit: Payer: Self-pay | Admitting: *Deleted

## 2011-07-12 ENCOUNTER — Encounter: Payer: Self-pay | Admitting: Surgery

## 2011-07-12 DIAGNOSIS — I6529 Occlusion and stenosis of unspecified carotid artery: Secondary | ICD-10-CM

## 2011-07-12 NOTE — Procedures (Unsigned)
CAROTID DUPLEX EXAM  INDICATION:  Carotid disease.  HISTORY: Diabetes:  No. Cardiac:  No. Hypertension:  No. Smoking:  No. Previous Surgery:  No. CV History:  Complaint of chronic occasional right upper extremity numbness. Amaurosis Fugax No, Paresthesias No, Hemiparesis No.                                      RIGHT             LEFT Brachial systolic pressure:         138               136 Brachial Doppler waveforms:         Normal            Normal Vertebral direction of flow:        Antegrade         Antegrade DUPLEX VELOCITIES (cm/sec) CCA peak systolic                   87                77 ECA peak systolic                   74                150 ICA peak systolic                   66                68 ICA end diastolic                   23                29 PLAQUE MORPHOLOGY:                                    Homogenous PLAQUE AMOUNT:                      None              Minimal PLAQUE LOCATION:                                      Proximal ICA  IMPRESSION:  No hemodynamically significant stenosis of the bilateral internal carotid arteries noted with minimal plaque formation as described above.  No significant change in Doppler velocities when compared to the previous exam on 07/20/2009.  ___________________________________________ V. Charlena Cross, MD  CH/MEDQ  D:  07/04/2011  T:  07/04/2011  Job:  [id number]

## 2011-11-29 ENCOUNTER — Other Ambulatory Visit: Payer: Self-pay | Admitting: Urology

## 2011-11-29 DIAGNOSIS — H9319 Tinnitus, unspecified ear: Secondary | ICD-10-CM

## 2011-11-29 DIAGNOSIS — G43909 Migraine, unspecified, not intractable, without status migrainosus: Secondary | ICD-10-CM

## 2011-12-01 ENCOUNTER — Ambulatory Visit
Admission: RE | Admit: 2011-12-01 | Discharge: 2011-12-01 | Disposition: A | Discharge status: Home / Self Care | Payer: BC Managed Care – PPO | Source: Ambulatory Visit | Attending: Urology | Admitting: Urology

## 2011-12-01 DIAGNOSIS — H9319 Tinnitus, unspecified ear: Secondary | ICD-10-CM

## 2011-12-01 DIAGNOSIS — G43909 Migraine, unspecified, not intractable, without status migrainosus: Secondary | ICD-10-CM

## 2011-12-05 ENCOUNTER — Other Ambulatory Visit: Payer: Self-pay | Admitting: Urology

## 2011-12-05 DIAGNOSIS — R51 Headache: Secondary | ICD-10-CM

## 2011-12-05 DIAGNOSIS — H9319 Tinnitus, unspecified ear: Secondary | ICD-10-CM

## 2011-12-08 ENCOUNTER — Ambulatory Visit
Admission: RE | Admit: 2011-12-08 | Discharge: 2011-12-08 | Disposition: A | Discharge status: Home / Self Care | Payer: BC Managed Care – PPO | Source: Ambulatory Visit | Attending: Urology | Admitting: Urology

## 2011-12-08 DIAGNOSIS — H9319 Tinnitus, unspecified ear: Secondary | ICD-10-CM

## 2011-12-08 DIAGNOSIS — R51 Headache: Secondary | ICD-10-CM

## 2011-12-08 MED ORDER — IOHEXOL 350 MG/ML SOLN
100.0000 mL | Freq: Once | INTRAVENOUS | Status: AC | PRN
  Administered 2011-12-08: 100 mL via INTRAVENOUS

## 2012-03-19 LAB — PROTIME-INR

## 2012-03-28 LAB — PROTIME-INR

## 2012-04-12 ENCOUNTER — Other Ambulatory Visit: Payer: Self-pay | Admitting: Family Medicine

## 2012-04-12 DIAGNOSIS — E049 Nontoxic goiter, unspecified: Secondary | ICD-10-CM

## 2012-04-12 LAB — PROTIME-INR

## 2012-04-18 LAB — PROTIME-INR

## 2012-04-23 ENCOUNTER — Other Ambulatory Visit: Payer: Self-pay | Admitting: Urology

## 2012-04-23 DIAGNOSIS — I6529 Occlusion and stenosis of unspecified carotid artery: Secondary | ICD-10-CM

## 2012-05-01 ENCOUNTER — Ambulatory Visit
Admission: RE | Admit: 2012-05-01 | Discharge: 2012-05-01 | Disposition: A | Discharge status: Home / Self Care | Payer: BC Managed Care – PPO | Source: Ambulatory Visit | Attending: Family Medicine | Admitting: Family Medicine

## 2012-05-01 ENCOUNTER — Ambulatory Visit
Admission: RE | Admit: 2012-05-01 | Discharge: 2012-05-01 | Disposition: A | Discharge status: Home / Self Care | Payer: BC Managed Care – PPO | Source: Ambulatory Visit | Attending: Urology | Admitting: Urology

## 2012-05-01 DIAGNOSIS — I6529 Occlusion and stenosis of unspecified carotid artery: Secondary | ICD-10-CM

## 2012-05-01 DIAGNOSIS — E049 Nontoxic goiter, unspecified: Secondary | ICD-10-CM

## 2012-05-01 MED ORDER — IOHEXOL 350 MG/ML SOLN
100.0000 mL | Freq: Once | INTRAVENOUS | Status: AC | PRN
  Administered 2012-05-01: 100 mL via INTRAVENOUS

## 2012-05-27 ENCOUNTER — Telehealth: Payer: Self-pay | Admitting: Oncology

## 2012-05-27 NOTE — Telephone Encounter (Signed)
C/D 05/27/12 for appt. 06/19/12

## 2012-06-19 ENCOUNTER — Ambulatory Visit: Payer: BC Managed Care – PPO | Admitting: Oncology

## 2012-06-19 ENCOUNTER — Telehealth: Payer: Self-pay | Admitting: Oncology

## 2012-06-19 ENCOUNTER — Other Ambulatory Visit: Payer: BC Managed Care – PPO | Admitting: Lab

## 2012-06-19 NOTE — Telephone Encounter (Signed)
Referring Dr. Lupita Raider.  Dx-Anticoag

## 2012-06-21 ENCOUNTER — Other Ambulatory Visit: Payer: Self-pay | Admitting: Medical Oncology

## 2012-06-21 DIAGNOSIS — D699 Hemorrhagic condition, unspecified: Secondary | ICD-10-CM

## 2012-06-24 ENCOUNTER — Ambulatory Visit: Payer: BC Managed Care – PPO | Admitting: Oncology

## 2012-06-24 ENCOUNTER — Other Ambulatory Visit: Payer: BC Managed Care – PPO | Admitting: Lab

## 2012-07-08 ENCOUNTER — Ambulatory Visit (HOSPITAL_BASED_OUTPATIENT_CLINIC_OR_DEPARTMENT_OTHER): Payer: BC Managed Care – PPO | Admitting: Oncology

## 2012-07-08 ENCOUNTER — Other Ambulatory Visit: Payer: BC Managed Care – PPO

## 2012-07-08 ENCOUNTER — Other Ambulatory Visit (HOSPITAL_BASED_OUTPATIENT_CLINIC_OR_DEPARTMENT_OTHER): Payer: BC Managed Care – PPO | Admitting: Lab

## 2012-07-08 ENCOUNTER — Ambulatory Visit: Payer: BC Managed Care – PPO | Admitting: Surgery

## 2012-07-08 VITALS — BP 147/86 | HR 75 | Temp 98.3°F | Resp 20 | Ht 62.5 in | Wt 143.8 lb

## 2012-07-08 DIAGNOSIS — Z7901 Long term (current) use of anticoagulants: Secondary | ICD-10-CM

## 2012-07-08 DIAGNOSIS — D6859 Other primary thrombophilia: Secondary | ICD-10-CM

## 2012-07-08 DIAGNOSIS — D699 Hemorrhagic condition, unspecified: Secondary | ICD-10-CM

## 2012-07-08 LAB — CBC WITH DIFFERENTIAL/PLATELET
BASO%: 1.8 % (ref 0.0–2.0)
EOS%: 9.1 % — ABNORMAL HIGH (ref 0.0–7.0)
HCT: 33.8 % — ABNORMAL LOW (ref 34.8–46.6)
MCH: 31.1 pg (ref 25.1–34.0)
MCHC: 33.1 g/dL (ref 31.5–36.0)
NEUT%: 44.2 % (ref 38.4–76.8)
RBC: 3.6 10*6/uL — ABNORMAL LOW (ref 3.70–5.45)
lymph#: 1.4 10*3/uL (ref 0.9–3.3)

## 2012-07-08 LAB — COMPREHENSIVE METABOLIC PANEL (CC13)
ALT: 19 U/L (ref 0–55)
CO2: 27 mEq/L (ref 22–29)
Creatinine: 0.8 mg/dL (ref 0.6–1.1)
Glucose: 82 mg/dl (ref 70–99)
Total Bilirubin: 0.23 mg/dL (ref 0.20–1.20)

## 2012-07-08 LAB — PROTIME-INR

## 2012-07-08 NOTE — Progress Notes (Signed)
OFFICE PROGRESS NOTE  CC  SHAW,[name], MD [address] [place] [address]  DIAGNOSIS: History of hypercoagulability and 47 year old female. Patient is on anticoagulation.  PRIOR THERAPY:  #1 patient in the past was seen by me for neurologic symptoms. At that time she had symptomatic relief with being on anticoagulation. 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 She is seen by a neurologist. She is on long-term anticoagulation. However apparently they're not able to regulate the INRs.  CURRENT THERAPY:Coumadin  INTERVAL HISTORY: Julie Stone 47 y.o. female returns for for a visit after 2 years. 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 She is followed by a neurologist. She is on lifelong anticoagulation. She tells me that she is not able to regulate her INRs. She was referred here to see if she would be a candidate for alternative anticoagulation.  MEDICAL HISTORY:No past medical history on file.  ALLERGIES:  is allergic to darvon; morphine and related; and sulfa antibiotics.  MEDICATIONS:  Current Outpatient Prescriptions  Medication Sig Dispense Refill  . buPROPion (WELLBUTRIN XL) 300 MG 24 hr tablet Take 300 mg by mouth daily.      Marland Kitchen donepezil (ARICEPT) 5 MG tablet Take 5 mg by mouth at bedtime as needed.      . levETIRAcetam (KEPPRA) 250 MG tablet Take 250 mg by mouth daily.      . OnabotulinumtoxinA (BOTOX IJ) Inject as directed. Every 3 months      . venlafaxine (EFFEXOR) 100 MG tablet Take 300 mg by mouth daily.      Marland Kitchen warfarin (COUMADIN) 10 MG tablet Take 10 mg by mouth daily. As directed per PT/INR       No current facility-administered medications for this visit.    SURGICAL HISTORY: No past surgical history on file.  REVIEW OF SYSTEMS:  A comprehensive review of systems was negative.   PHYSICAL EXAMINATION: Blood pressure 147/86, pulse 75, temperature 98.3 F (36.8 C), temperature source Oral, resp.  rate 20, height 5' 2.5" (1.588 m), weight 143 lb 12.8 oz (65.227 kg). Body mass index is 25.87 kg/(m^2). ECOG PERFORMANCE STATUS: 1 - Symptomatic but completely ambulatory   not performed   LABORATORY DATA: Lab Results  Component Value Date   WBC 3.9 07/08/2012   HGB 11.2* 07/08/2012   HCT 33.8* 07/08/2012   MCV 93.9 07/08/2012   PLT 291 07/08/2012      Chemistry      Component Value Date/Time   NA 133* 07/08/2012 0941   NA 130* 11/24/2010 0939   NA 140 01/26/2009 0847   K 4.7 07/08/2012 0941   K 4.1 11/24/2010 0939   K 4.1 01/26/2009 0847   CL 98 07/08/2012 0941   CL 96 11/24/2010 0939   CL 99 01/26/2009 0847   CO2 27 07/08/2012 0941   CO2 25 11/24/2010 0939   CO2 31 01/26/2009 0847   BUN 10.1 07/08/2012 0941   BUN 10 11/24/2010 0939   BUN 13 01/26/2009 0847   CREATININE 0.8 07/08/2012 0941   CREATININE 0.82 11/24/2010 0939   CREATININE 1.0 01/26/2009 0847      Component Value Date/Time   CALCIUM 9.1 07/08/2012 0941   CALCIUM 9.2 11/24/2010 0939   CALCIUM 9.2 01/26/2009 0847   ALKPHOS 79 07/08/2012 0941   ALKPHOS 64 11/24/2010 0939   ALKPHOS 83 01/26/2009 0847   AST 18 07/08/2012 0941   AST 22 11/24/2010 0939   AST 38 01/26/2009 0847   ALT 19  07/08/2012 0941   ALT 17 11/24/2010 0939   BILITOT 0.23 07/08/2012 0941   BILITOT 0.3 11/24/2010 0939   BILITOT 0.30 01/26/2009 0847       RADIOGRAPHIC STUDIES:  No results found.  ASSESSMENT: 47 year old female with history of neurologic symptoms that respond to Coumadin chronically. However she was recently diagnosed with FMD. I told the patient that I am not familiar with this diagnosis. Apparently she does need lifelong anticoagulation. Her INRs are not well regulated. Her neurologist would like her INRs to be in the high 3 range. Although I have no communication from the neurologist. I recommended that patient be seen at a tertiary care center for her anticoagulation and hypercoagulability. My experience is very limited in FMD.   PLAN: patient will be  calling her primary care physician and be referred to hematology at [facility].   All questions were answered. The patient knows to call the clinic with any problems, questions or concerns. We can certainly see the patient much sooner if necessary.  I spent 15 minutes counseling the patient face to face. The total time spent in the appointment was 15 minutes.    Drue Second, MD Medical/Oncology [facility] [phone] (beeper) [phone] (Office)  07/08/2012, 12:26 PM

## 2012-07-18 ENCOUNTER — Telehealth: Payer: Self-pay | Admitting: Medical Oncology

## 2012-07-18 ENCOUNTER — Telehealth: Payer: Self-pay | Admitting: Hematology & Oncology

## 2012-07-18 NOTE — Telephone Encounter (Signed)
LMOVM with Raiford Noble at Dr. Gustavo Lah office regarding referral to their office. Per Dr Welton Flakes patient was seen 03/17 and referred pt to hematology at [facility].

## 2012-07-18 NOTE — Telephone Encounter (Signed)
Looks like pt saw Dr. Welton Flakes on 3-17 the same day we received referra from Dr. Chauncy Passy. I talked with Hale Drone Dr. Milta Deiters Nurse and she will check with MD to make sure we don't need to see this patient.

## 2012-07-18 NOTE — Telephone Encounter (Signed)
Mira RN left message Pt does not need to see [place] per Dr. Welton Flakes

## 2012-07-19 ENCOUNTER — Encounter: Payer: Self-pay | Admitting: Surgery

## 2012-07-22 ENCOUNTER — Ambulatory Visit (INDEPENDENT_AMBULATORY_CARE_PROVIDER_SITE_OTHER): Payer: BC Managed Care – PPO | Admitting: Surgery

## 2012-07-22 ENCOUNTER — Ambulatory Visit (INDEPENDENT_AMBULATORY_CARE_PROVIDER_SITE_OTHER): Payer: BC Managed Care – PPO | Admitting: *Deleted

## 2012-07-22 ENCOUNTER — Encounter: Payer: Self-pay | Admitting: Surgery

## 2012-07-22 DIAGNOSIS — I6529 Occlusion and stenosis of unspecified carotid artery: Secondary | ICD-10-CM | POA: Insufficient documentation

## 2012-07-22 NOTE — Progress Notes (Signed)
[facility] [place]   Patient name: Julie Stone MRN: [id number] DOB: 1965/07/20 Sex: female     Chief Complaint  Patient presents with  . Carotid    One year f/up with vascular Lab study.      HISTORY OF PRESENT ILLNESS: The patient is back today for followup. I initially met her in 2010 when she presented with a blue right from. Her only significant history to that point had been migraine headaches with overall. Her over consisting of a stroke like symptoms with passing out. She was admitted to the hospital overnight and placed on heparin. She had undergone a CT angiogram which revealed no etiology for her embolic process. She had ultrasound studies which showed digital pressures consistent with embolic disease. She had a significant improvement in a coloring of her finger with heparin. She was placed on Coumadin. She had a dramatic response to Coumadin. Her cholesterol panel was found to be normal. It is been some time since I have seen her. She had an episode where she was taken off of her Coumadin, however she is now back on Coumadin. She has been having difficulty getting her INR regulated. She is set to see a hematologist in the near future. 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  Past Medical History  Diagnosis Date  . Carotid artery occlusion   . 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 Abdominal hysterectomy    . Foot surgery Right   . Wrist surgery Right     History   Social History  . Marital Status: Married    Spouse Name: N/A    Number of Children: N/A  . Years of Education: N/A   Occupational History  . Not on file.   Social History Main Topics  . Smoking status: Never Smoker   . Smokeless tobacco: Never Used  . Alcohol Use: 0.6 oz/week    1 Glasses of wine per week  . Drug Use: No  . Sexually Active:  Not on file   Other Topics Concern  . Not on file   Social History Narrative  . No narrative on file    Family History  Problem Relation Age of Onset  . Hypertension Mother   . Migraines Father     Allergies as of 07/22/2012 - Review Complete 07/22/2012  Allergen Reaction Noted  . Darvon (propoxyphene hcl)  07/08/2012  . Morphine and related Itching 07/08/2012  . Sulfa antibiotics  07/08/2012    Current Outpatient Prescriptions on File Prior to Visit  Medication Sig Dispense Refill  . buPROPion (WELLBUTRIN XL) 300 MG 24 hr tablet Take 300 mg by mouth daily.      Marland Kitchen donepezil (ARICEPT) 5 MG tablet Take 5 mg by mouth at bedtime as needed.      . levETIRAcetam (KEPPRA) 250 MG tablet Take 250 mg by mouth daily.      . OnabotulinumtoxinA (BOTOX IJ) Inject as directed. Every 3 months      . venlafaxine (EFFEXOR) 100 MG tablet Take 300 mg by mouth daily.      Marland Kitchen warfarin (COUMADIN) 10 MG tablet Take 10 mg by mouth daily. As directed per PT/INR       No current facility-administered medications on file prior to visit.     REVIEW OF SYSTEMS: See history of present illness, otherwise all systems are negative  PHYSICAL EXAMINATION:   Vital signs are BP 137/79  Pulse 66  Resp 16  Ht 5' 2.75" (1.594 m)  Wt 133 lb (60.328 kg)  BMI 23.74 kg/m2  SpO2 100% General: The patient appears their stated age. HEENT:  No gross abnormalities Pulmonary:  Non labored breathing Musculoskeletal: There are no major deformities. Neurologic: No focal weakness or paresthesias are detected, Skin: There are no ulcer or rashes noted. Psychiatric: The patient has normal affect. Cardiovascular: There is a regular rate and rhythm without significant murmur appreciated. No carotid bruits. Palpable radial pulses bilaterally   Diagnostic Studies Carotid duplex was performed today which shows no evidence of hemodynamically significant stenosis.  Assessment: History of right blue from Plan: From a  vascular perspective, the patient has remained very stable. She continues to be on anticoagulation however she has had difficulty regulating her INR. She may benefit from some of the newer agents, however this will be discussed with hematology. Currently, she is being evaluated for FMD. She has had her brain and kidneys evaluated.  The patient is requesting that she contact me should any additional vascular services be required. I feel this is reasonable. She'll follow up on a when necessary basis.  Jorge Ny, M.D. [facility] [place] Office: [phone] Pager:  [phone]

## 2012-07-23 ENCOUNTER — Telehealth: Payer: Self-pay | Admitting: Hematology & Oncology

## 2012-07-23 NOTE — Telephone Encounter (Signed)
Dianna from Dr. Alver Fisher office called. She is aware Dr. Park Breed has sent referral to [facility] for Pt. I faxed MD note to [phone]

## 2012-08-19 DIAGNOSIS — Z86718 Personal history of other venous thrombosis and embolism: Secondary | ICD-10-CM | POA: Insufficient documentation

## 2013-05-01 ENCOUNTER — Other Ambulatory Visit: Payer: Self-pay | Admitting: Urology

## 2013-05-01 DIAGNOSIS — R51 Headache: Principal | ICD-10-CM

## 2013-05-01 DIAGNOSIS — R519 Headache, unspecified: Secondary | ICD-10-CM

## 2013-05-07 ENCOUNTER — Other Ambulatory Visit: Payer: BC Managed Care – PPO

## 2013-05-09 ENCOUNTER — Ambulatory Visit
Admission: RE | Admit: 2013-05-09 | Discharge: 2013-05-09 | Disposition: A | Discharge status: Home / Self Care | Payer: BC Managed Care – PPO | Source: Ambulatory Visit | Attending: Urology | Admitting: Urology

## 2013-05-09 DIAGNOSIS — R51 Headache: Principal | ICD-10-CM

## 2013-05-09 DIAGNOSIS — R519 Headache, unspecified: Secondary | ICD-10-CM

## 2013-05-09 MED ORDER — IOHEXOL 350 MG/ML SOLN
100.0000 mL | Freq: Once | INTRAVENOUS | Status: AC | PRN
  Administered 2013-05-09: 100 mL via INTRAVENOUS

## 2013-06-13 ENCOUNTER — Other Ambulatory Visit: Payer: Self-pay | Admitting: Family Medicine

## 2013-06-13 DIAGNOSIS — Z1231 Encounter for screening mammogram for malignant neoplasm of breast: Secondary | ICD-10-CM

## 2013-06-19 ENCOUNTER — Ambulatory Visit: Payer: BC Managed Care – PPO

## 2013-08-28 DIAGNOSIS — M4307 Spondylolysis, lumbosacral region: Secondary | ICD-10-CM | POA: Insufficient documentation

## 2013-08-28 DIAGNOSIS — M545 Low back pain, unspecified: Secondary | ICD-10-CM | POA: Insufficient documentation

## 2013-08-29 ENCOUNTER — Other Ambulatory Visit: Payer: Self-pay | Admitting: Neurosurgery

## 2013-10-06 ENCOUNTER — Encounter (HOSPITAL_COMMUNITY)
Admission: RE | Admit: 2013-10-06 | Discharge: 2013-10-06 | Disposition: A | Discharge status: Home / Self Care | Payer: BC Managed Care – PPO | Source: Ambulatory Visit | Attending: Neurosurgery | Admitting: Neurosurgery

## 2013-10-06 ENCOUNTER — Encounter (HOSPITAL_COMMUNITY): Payer: Self-pay

## 2013-10-06 DIAGNOSIS — Z01812 Encounter for preprocedural laboratory examination: Secondary | ICD-10-CM | POA: Insufficient documentation

## 2013-10-06 HISTORY — DX: Gastro-esophageal reflux disease without esophagitis: K21.9

## 2013-10-06 HISTORY — DX: Major depressive disorder, single episode, unspecified: F32.9

## 2013-10-06 HISTORY — DX: Depression, unspecified: F32.A

## 2013-10-06 HISTORY — DX: Migraine, unspecified, not intractable, without status migrainosus: G43.909

## 2013-10-06 LAB — CBC
HEMATOCRIT: 33.1 % — AB (ref 36.0–46.0)
HEMOGLOBIN: 11.2 g/dL — AB (ref 12.0–15.0)
MCH: 32.9 pg (ref 26.0–34.0)
MCHC: 33.8 g/dL (ref 30.0–36.0)
MCV: 97.4 fL (ref 78.0–100.0)
Platelets: 300 10*3/uL (ref 150–400)
RBC: 3.4 MIL/uL — ABNORMAL LOW (ref 3.87–5.11)
RDW: 13.4 % (ref 11.5–15.5)
WBC: 5.7 10*3/uL (ref 4.0–10.5)

## 2013-10-06 LAB — BASIC METABOLIC PANEL
BUN: 12 mg/dL (ref 6–23)
CHLORIDE: 95 meq/L — AB (ref 96–112)
CO2: 28 mEq/L (ref 19–32)
Calcium: 8.8 mg/dL (ref 8.4–10.5)
Creatinine, Ser: 0.68 mg/dL (ref 0.50–1.10)
GFR calc Af Amer: >90 mL/min (ref >90)
Glucose, Bld: 94 mg/dL (ref 70–99)
Potassium: 4.4 mEq/L (ref 3.7–5.3)
Sodium: 133 mEq/L — ABNORMAL LOW (ref 137–147)

## 2013-10-06 LAB — TYPE AND SCREEN
ABO/RH(D): O POS
ANTIBODY SCREEN: NEGATIVE

## 2013-10-06 LAB — ABO/RH: ABO/RH(D): O POS

## 2013-10-06 LAB — SURGICAL PCR SCREEN
MRSA, PCR: NEGATIVE
Staphylococcus aureus: NEGATIVE

## 2013-10-06 LAB — PROTIME-INR
INR: 1 (ref 0.00–1.49)
Prothrombin Time: 13 seconds (ref 11.6–15.2)

## 2013-10-06 LAB — APTT: aPTT: 27 seconds (ref 24–37)

## 2013-10-06 NOTE — Progress Notes (Signed)
Primary - dr. Philipp DeputyKim shaw Does not have cardiologist No recent cardiac testing

## 2013-10-06 NOTE — Pre-Procedure Instructions (Signed)
Julie Stone  10/06/2013   Your procedure is scheduled on:  Wednesday, June 24th  [facility] at 0630 AM.  Call this number if you have problems the morning of surgery: [phone]   Remember:   Do not eat food or drink liquids after midnight.   Take these medicines the morning of surgery with A SIP OF WATER: wellbutrin, keppra, prednisone, effexor  Per MD instructions regarding coumadin  Stop taking OTC vitamins, aspirin (including excedrin migraine), NSAIDS (ibuprofen, advil) as of 6/18   Do not wear jewelry, make-up or nail polish.  Do not wear lotions, powders, or perfumes. You may wear deodorant.  Do not shave 48 hours prior to surgery. Men may shave face and neck.  Do not bring valuables to the hospital.  [facility] is not responsible  for any belongings or valuables.               Contacts, dentures or bridgework may not be worn into surgery.  Leave suitcase in the car. After surgery it may be brought to your room.  For patients admitted to the hospital, discharge time is determined by your treatment team.               Patients discharged the day of surgery will not be allowed to drive home.  Please read over the following fact sheets that you were given: Pain Booklet, Coughing and Deep Breathing, Blood Transfusion Information, MRSA Information and Surgical Site Infection Prevention [place] - Preparing for Surgery  Before surgery, you can play an important role.  Because skin is not sterile, your skin needs to be as free of germs as possible.  You can reduce the number of germs on you skin by washing with CHG (chlorahexidine gluconate) soap before surgery.  CHG is an antiseptic cleaner which kills germs and bonds with the skin to continue killing germs even after washing.  Please DO NOT use if you have an allergy to CHG or antibacterial soaps.  If your skin becomes reddened/irritated stop using the CHG and inform your nurse when you  arrive at Short Stay.  Do not shave (including legs and underarms) for at least 48 hours prior to the first CHG shower.  You may shave your face.  Please follow these instructions carefully:   1.  Shower with CHG Soap the night before surgery and the morning of Surgery.  2.  If you choose to wash your hair, wash your hair first as usual with your normal shampoo.  3.  After you shampoo, rinse your hair and body thoroughly to remove the shampoo.  4.  Use CHG as you would any other liquid soap.  You can apply CHG directly to the skin and wash gently with scrungie or a clean washcloth.  5.  Apply the CHG Soap to your body ONLY FROM THE NECK DOWN.  Do not use on open wounds or open sores.  Avoid contact with your eyes, ears, mouth and genitals (private parts).  Wash genitals (private parts) with your normal soap.  6.  Wash thoroughly, paying special attention to the area where your surgery will be performed.  7.  Thoroughly rinse your body with warm water from the neck down.  8.  DO NOT shower/wash with your normal soap after using and rinsing off the CHG Soap.  9.  Pat yourself dry with a clean towel.            10.  Wear clean pajamas.            11.  Place clean sheets on your bed the night of your first shower and do not sleep with pets.  Day of Surgery  Do not apply any lotions/deoderants the morning of surgery.  Please wear clean clothes to the hospital/surgery center.

## 2013-10-14 MED ORDER — CEFAZOLIN SODIUM-DEXTROSE 2-3 GM-% IV SOLR
2.0000 g | INTRAVENOUS | Status: AC
  Administered 2013-10-15: 2 g via INTRAVENOUS
  Filled 2013-10-14: qty 50

## 2013-10-15 ENCOUNTER — Encounter (HOSPITAL_COMMUNITY): Payer: BC Managed Care – PPO | Admitting: Anesthesiology

## 2013-10-15 ENCOUNTER — Inpatient Hospital Stay (HOSPITAL_COMMUNITY)
Admission: RE | Admit: 2013-10-15 | Discharge: 2013-10-20 | DRG: 460 | Disposition: A | Discharge status: Home / Self Care | Payer: BC Managed Care – PPO | Source: Ambulatory Visit | Attending: Neurosurgery | Admitting: Neurosurgery

## 2013-10-15 ENCOUNTER — Encounter (HOSPITAL_COMMUNITY)
Admission: RE | Disposition: A | Discharge status: Home / Self Care | Payer: BC Managed Care – PPO | Source: Ambulatory Visit | Attending: Neurosurgery

## 2013-10-15 ENCOUNTER — Encounter (HOSPITAL_COMMUNITY): Payer: Self-pay | Admitting: Surgery

## 2013-10-15 ENCOUNTER — Inpatient Hospital Stay (HOSPITAL_COMMUNITY): Payer: BC Managed Care – PPO | Admitting: Anesthesiology

## 2013-10-15 ENCOUNTER — Inpatient Hospital Stay (HOSPITAL_COMMUNITY): Payer: BC Managed Care – PPO

## 2013-10-15 DIAGNOSIS — G7109 Other specified muscular dystrophies: Secondary | ICD-10-CM | POA: Diagnosis present

## 2013-10-15 DIAGNOSIS — K219 Gastro-esophageal reflux disease without esophagitis: Secondary | ICD-10-CM | POA: Diagnosis present

## 2013-10-15 DIAGNOSIS — Z7901 Long term (current) use of anticoagulants: Secondary | ICD-10-CM

## 2013-10-15 DIAGNOSIS — F3289 Other specified depressive episodes: Secondary | ICD-10-CM | POA: Diagnosis present

## 2013-10-15 DIAGNOSIS — Z86718 Personal history of other venous thrombosis and embolism: Secondary | ICD-10-CM

## 2013-10-15 DIAGNOSIS — M48062 Spinal stenosis, lumbar region with neurogenic claudication: Secondary | ICD-10-CM | POA: Diagnosis present

## 2013-10-15 DIAGNOSIS — G43909 Migraine, unspecified, not intractable, without status migrainosus: Secondary | ICD-10-CM | POA: Diagnosis present

## 2013-10-15 DIAGNOSIS — Z7902 Long term (current) use of antithrombotics/antiplatelets: Secondary | ICD-10-CM

## 2013-10-15 DIAGNOSIS — F329 Major depressive disorder, single episode, unspecified: Secondary | ICD-10-CM | POA: Diagnosis present

## 2013-10-15 DIAGNOSIS — Z79899 Other long term (current) drug therapy: Secondary | ICD-10-CM

## 2013-10-15 DIAGNOSIS — M4316 Spondylolisthesis, lumbar region: Secondary | ICD-10-CM | POA: Diagnosis present

## 2013-10-15 DIAGNOSIS — M5137 Other intervertebral disc degeneration, lumbosacral region: Secondary | ICD-10-CM | POA: Diagnosis present

## 2013-10-15 DIAGNOSIS — M51379 Other intervertebral disc degeneration, lumbosacral region without mention of lumbar back pain or lower extremity pain: Secondary | ICD-10-CM | POA: Diagnosis present

## 2013-10-15 DIAGNOSIS — Q762 Congenital spondylolisthesis: Principal | ICD-10-CM

## 2013-10-15 HISTORY — DX: Personal history of other venous thrombosis and embolism: Z86.718

## 2013-10-15 SURGERY — POSTERIOR LUMBAR FUSION 1 LEVEL
Anesthesia: General

## 2013-10-15 MED ORDER — PROPOFOL 10 MG/ML IV BOLUS
INTRAVENOUS | Status: AC
  Filled 2013-10-15: qty 20

## 2013-10-15 MED ORDER — VENLAFAXINE HCL ER 75 MG PO CP24
300.0000 mg | ORAL_CAPSULE | Freq: Every day | ORAL | Status: DC
  Administered 2013-10-16 – 2013-10-20 (×5): 300 mg via ORAL
  Filled 2013-10-15 (×5): qty 4

## 2013-10-15 MED ORDER — MENTHOL 3 MG MT LOZG: 1.0000 | LOZENGE | OROMUCOSAL | Status: DC | PRN

## 2013-10-15 MED ORDER — PROPOFOL 10 MG/ML IV BOLUS
INTRAVENOUS | Status: DC | PRN
  Administered 2013-10-15: 110 mg via INTRAVENOUS

## 2013-10-15 MED ORDER — ROCURONIUM BROMIDE 100 MG/10ML IV SOLN
INTRAVENOUS | Status: DC | PRN
  Administered 2013-10-15: 20 mg via INTRAVENOUS
  Administered 2013-10-15: 50 mg via INTRAVENOUS

## 2013-10-15 MED ORDER — LACTATED RINGERS IV SOLN
INTRAVENOUS | Status: DC
  Administered 2013-10-15 – 2013-10-16 (×2): via INTRAVENOUS

## 2013-10-15 MED ORDER — HYDROMORPHONE HCL PF 1 MG/ML IJ SOLN
INTRAMUSCULAR | Status: AC
  Filled 2013-10-15: qty 1

## 2013-10-15 MED ORDER — GLYCOPYRROLATE 0.2 MG/ML IJ SOLN
INTRAMUSCULAR | Status: AC
  Filled 2013-10-15: qty 3

## 2013-10-15 MED ORDER — FENTANYL CITRATE 0.05 MG/ML IJ SOLN
INTRAMUSCULAR | Status: AC
  Filled 2013-10-15: qty 5

## 2013-10-15 MED ORDER — MIDAZOLAM HCL 5 MG/5ML IJ SOLN
INTRAMUSCULAR | Status: DC | PRN
  Administered 2013-10-15 (×2): 1 mg via INTRAVENOUS

## 2013-10-15 MED ORDER — ONDANSETRON HCL 4 MG/2ML IJ SOLN
INTRAMUSCULAR | Status: AC
  Filled 2013-10-15: qty 2

## 2013-10-15 MED ORDER — ADULT MULTIVITAMIN W/MINERALS CH
1.0000 | ORAL_TABLET | Freq: Every day | ORAL | Status: DC
  Administered 2013-10-15 – 2013-10-20 (×6): 1 via ORAL
  Filled 2013-10-15 (×7): qty 1

## 2013-10-15 MED ORDER — 0.9 % SODIUM CHLORIDE (POUR BTL) OPTIME
TOPICAL | Status: DC | PRN
  Administered 2013-10-15: 1000 mL

## 2013-10-15 MED ORDER — LACTATED RINGERS IV SOLN
INTRAVENOUS | Status: DC | PRN
  Administered 2013-10-15 (×3): via INTRAVENOUS

## 2013-10-15 MED ORDER — HYDROMORPHONE HCL PF 1 MG/ML IJ SOLN
0.5000 mg | INTRAMUSCULAR | Status: DC | PRN
  Administered 2013-10-15 – 2013-10-19 (×7): 1 mg via INTRAVENOUS
  Administered 2013-10-19: 0.5 mg via INTRAVENOUS
  Administered 2013-10-20: 1 mg via INTRAVENOUS
  Filled 2013-10-15 (×9): qty 1

## 2013-10-15 MED ORDER — OXYCODONE-ACETAMINOPHEN 5-325 MG PO TABS
1.0000 | ORAL_TABLET | ORAL | Status: DC | PRN
  Administered 2013-10-15 – 2013-10-16 (×4): 2 via ORAL
  Administered 2013-10-16: 1 via ORAL
  Administered 2013-10-16: 2 via ORAL
  Administered 2013-10-17 (×2): 1 via ORAL
  Administered 2013-10-17: 2 via ORAL
  Administered 2013-10-17 (×2): 1 via ORAL
  Administered 2013-10-17 – 2013-10-20 (×14): 2 via ORAL
  Filled 2013-10-15 (×4): qty 2
  Filled 2013-10-15: qty 1
  Filled 2013-10-15 (×4): qty 2
  Filled 2013-10-15: qty 1
  Filled 2013-10-15 (×12): qty 2
  Filled 2013-10-15: qty 1
  Filled 2013-10-15: qty 2
  Filled 2013-10-15: qty 1
  Filled 2013-10-15 (×2): qty 2

## 2013-10-15 MED ORDER — HYDROCODONE-ACETAMINOPHEN 5-325 MG PO TABS: 1.0000 | ORAL_TABLET | ORAL | Status: DC | PRN

## 2013-10-15 MED ORDER — OXYCODONE HCL 5 MG/5ML PO SOLN: 5.0000 mg | Freq: Once | ORAL | Status: DC | PRN

## 2013-10-15 MED ORDER — THROMBIN 20000 UNITS EX SOLR
CUTANEOUS | Status: DC | PRN
  Administered 2013-10-15: 10:00:00 via TOPICAL

## 2013-10-15 MED ORDER — ROCURONIUM BROMIDE 50 MG/5ML IV SOLN
INTRAVENOUS | Status: AC
  Filled 2013-10-15: qty 1

## 2013-10-15 MED ORDER — PHENOL 1.4 % MT LIQD: 1.0000 | OROMUCOSAL | Status: DC | PRN

## 2013-10-15 MED ORDER — LEVETIRACETAM 250 MG PO TABS
250.0000 mg | ORAL_TABLET | Freq: Every day | ORAL | Status: DC
  Administered 2013-10-15 – 2013-10-19 (×5): 250 mg via ORAL
  Filled 2013-10-15 (×5): qty 1

## 2013-10-15 MED ORDER — FENTANYL CITRATE 0.05 MG/ML IJ SOLN
INTRAMUSCULAR | Status: DC | PRN
  Administered 2013-10-15 (×2): 50 ug via INTRAVENOUS
  Administered 2013-10-15 (×3): 25 ug via INTRAVENOUS
  Administered 2013-10-15: 50 ug via INTRAVENOUS
  Administered 2013-10-15: 100 ug via INTRAVENOUS
  Administered 2013-10-15: 25 ug via INTRAVENOUS
  Administered 2013-10-15: 50 ug via INTRAVENOUS

## 2013-10-15 MED ORDER — NEOSTIGMINE METHYLSULFATE 10 MG/10ML IV SOLN
INTRAVENOUS | Status: DC | PRN
  Administered 2013-10-15: 3 mg via INTRAVENOUS

## 2013-10-15 MED ORDER — BUPROPION HCL ER (XL) 150 MG PO TB24
300.0000 mg | ORAL_TABLET | Freq: Every day | ORAL | Status: DC
  Administered 2013-10-16 – 2013-10-20 (×5): 300 mg via ORAL
  Filled 2013-10-15 (×6): qty 2

## 2013-10-15 MED ORDER — BACITRACIN ZINC 500 UNIT/GM EX OINT
TOPICAL_OINTMENT | CUTANEOUS | Status: DC | PRN
  Administered 2013-10-15: 1 via TOPICAL

## 2013-10-15 MED ORDER — HYDROMORPHONE HCL PF 1 MG/ML IJ SOLN
0.2500 mg | INTRAMUSCULAR | Status: DC | PRN
  Administered 2013-10-15 (×4): 0.5 mg via INTRAVENOUS

## 2013-10-15 MED ORDER — ONDANSETRON HCL 4 MG/2ML IJ SOLN: 4.0000 mg | Freq: Once | INTRAMUSCULAR | Status: DC | PRN

## 2013-10-15 MED ORDER — DEXTROSE 5 % IV SOLN
10.0000 mg | INTRAVENOUS | Status: DC | PRN
  Administered 2013-10-15: 10 ug/min via INTRAVENOUS

## 2013-10-15 MED ORDER — SUCCINYLCHOLINE CHLORIDE 20 MG/ML IJ SOLN
INTRAMUSCULAR | Status: AC
Start: 2013-10-15 — End: 2013-10-15
  Filled 2013-10-15: qty 1

## 2013-10-15 MED ORDER — OXYCODONE HCL 5 MG PO TABS: 5.0000 mg | ORAL_TABLET | Freq: Once | ORAL | Status: DC | PRN

## 2013-10-15 MED ORDER — DIAZEPAM 5 MG PO TABS
5.0000 mg | ORAL_TABLET | Freq: Four times a day (QID) | ORAL | Status: DC | PRN
  Administered 2013-10-15 – 2013-10-20 (×14): 5 mg via ORAL
  Filled 2013-10-15 (×14): qty 1

## 2013-10-15 MED ORDER — ALUM & MAG HYDROXIDE-SIMETH 200-200-20 MG/5ML PO SUSP
30.0000 mL | Freq: Four times a day (QID) | ORAL | Status: DC | PRN
  Administered 2013-10-15 – 2013-10-16 (×2): 30 mL via ORAL
  Filled 2013-10-15 (×2): qty 30

## 2013-10-15 MED ORDER — SODIUM CHLORIDE 0.9 % IR SOLN
Status: DC | PRN
  Administered 2013-10-15: 10:00:00

## 2013-10-15 MED ORDER — BUPIVACAINE LIPOSOME 1.3 % IJ SUSP
INTRAMUSCULAR | Status: DC | PRN
  Administered 2013-10-15: 20 mL

## 2013-10-15 MED ORDER — BUPIVACAINE LIPOSOME 1.3 % IJ SUSP
20.0000 mL | INTRAMUSCULAR | Status: DC
  Filled 2013-10-15: qty 20

## 2013-10-15 MED ORDER — LIDOCAINE HCL (CARDIAC) 20 MG/ML IV SOLN
INTRAVENOUS | Status: AC
  Filled 2013-10-15: qty 5

## 2013-10-15 MED ORDER — EPHEDRINE SULFATE 50 MG/ML IJ SOLN
INTRAMUSCULAR | Status: DC | PRN
  Administered 2013-10-15 (×2): 10 mg via INTRAVENOUS

## 2013-10-15 MED ORDER — ONDANSETRON HCL 4 MG/2ML IJ SOLN
INTRAMUSCULAR | Status: DC | PRN
  Administered 2013-10-15: 4 mg via INTRAVENOUS

## 2013-10-15 MED ORDER — GLYCOPYRROLATE 0.2 MG/ML IJ SOLN
INTRAMUSCULAR | Status: DC | PRN
  Administered 2013-10-15 (×2): 0.1 mg via INTRAVENOUS
  Administered 2013-10-15: 0.4 mg via INTRAVENOUS

## 2013-10-15 MED ORDER — BUPIVACAINE-EPINEPHRINE (PF) 0.5% -1:200000 IJ SOLN
INTRAMUSCULAR | Status: DC | PRN
  Administered 2013-10-15: 10 mL

## 2013-10-15 MED ORDER — MIDAZOLAM HCL 2 MG/2ML IJ SOLN
INTRAMUSCULAR | Status: AC
  Filled 2013-10-15: qty 2

## 2013-10-15 MED ORDER — NEOSTIGMINE METHYLSULFATE 10 MG/10ML IV SOLN
INTRAVENOUS | Status: AC
  Filled 2013-10-15: qty 1

## 2013-10-15 MED ORDER — CEFAZOLIN SODIUM-DEXTROSE 2-3 GM-% IV SOLR
2.0000 g | Freq: Three times a day (TID) | INTRAVENOUS | Status: AC
  Administered 2013-10-15 (×2): 2 g via INTRAVENOUS
  Filled 2013-10-15 (×2): qty 50

## 2013-10-15 MED ORDER — DOCUSATE SODIUM 100 MG PO CAPS
100.0000 mg | ORAL_CAPSULE | Freq: Two times a day (BID) | ORAL | Status: DC
  Administered 2013-10-15 – 2013-10-20 (×11): 100 mg via ORAL
  Filled 2013-10-15 (×12): qty 1

## 2013-10-15 MED ORDER — ACETAMINOPHEN 650 MG RE SUPP: 650.0000 mg | RECTAL | Status: DC | PRN

## 2013-10-15 MED ORDER — MEPERIDINE HCL 25 MG/ML IJ SOLN: 6.2500 mg | INTRAMUSCULAR | Status: DC | PRN

## 2013-10-15 MED ORDER — DONEPEZIL HCL 10 MG PO TABS
10.0000 mg | ORAL_TABLET | Freq: Every day | ORAL | Status: DC
  Administered 2013-10-16 – 2013-10-20 (×5): 10 mg via ORAL
  Filled 2013-10-15 (×5): qty 1

## 2013-10-15 MED ORDER — LIDOCAINE HCL (CARDIAC) 20 MG/ML IV SOLN
INTRAVENOUS | Status: DC | PRN
  Administered 2013-10-15: 100 mg via INTRAVENOUS

## 2013-10-15 MED ORDER — DIPHENHYDRAMINE HCL 25 MG PO TABS
25.0000 mg | ORAL_TABLET | Freq: Every evening | ORAL | Status: DC | PRN
  Administered 2013-10-15 (×2): 25 mg via ORAL
  Administered 2013-10-17: 50 mg via ORAL
  Administered 2013-10-18: 25 mg via ORAL
  Filled 2013-10-15: qty 2
  Filled 2013-10-15 (×2): qty 1
  Filled 2013-10-15: qty 2

## 2013-10-15 MED ORDER — ONDANSETRON HCL 4 MG/2ML IJ SOLN
4.0000 mg | INTRAMUSCULAR | Status: DC | PRN
  Administered 2013-10-15 – 2013-10-20 (×4): 4 mg via INTRAVENOUS
  Filled 2013-10-15 (×4): qty 2

## 2013-10-15 MED ORDER — ACETAMINOPHEN 325 MG PO TABS: 650.0000 mg | ORAL_TABLET | ORAL | Status: DC | PRN

## 2013-10-15 SURGICAL SUPPLY — 70 items
APL SKNCLS STERI-STRIP NONHPOA (GAUZE/BANDAGES/DRESSINGS) ×1
BAG DECANTER FOR FLEXI CONT (MISCELLANEOUS) ×3 IMPLANT
BENZOIN TINCTURE PRP APPL 2/3 (GAUZE/BANDAGES/DRESSINGS) ×3 IMPLANT
BLADE 10 SAFETY STRL DISP (BLADE) ×3 IMPLANT
BLADE SURG ROTATE 9660 (MISCELLANEOUS) IMPLANT
BRUSH SCRUB EZ PLAIN DRY (MISCELLANEOUS) ×3 IMPLANT
BUR ACORN 6.0 (BURR) ×2 IMPLANT
BUR ACORN 6.0MM (BURR) ×1
BUR MATCHSTICK NEURO 3.0 LAGG (BURR) ×3 IMPLANT
CANISTER SUCT 3000ML (MISCELLANEOUS) ×3 IMPLANT
CLOSURE WOUND 1/2 X4 (GAUZE/BANDAGES/DRESSINGS) ×1
CONT SPEC 4OZ CLIKSEAL STRL BL (MISCELLANEOUS) ×3 IMPLANT
COVER BACK TABLE 24X17X13 BIG (DRAPES) IMPLANT
COVER TABLE BACK 60X90 (DRAPES) ×3 IMPLANT
DRAPE C-ARM 42X72 X-RAY (DRAPES) ×6 IMPLANT
DRAPE LAPAROTOMY 100X72X124 (DRAPES) ×3 IMPLANT
DRAPE POUCH INSTRU U-SHP 10X18 (DRAPES) ×3 IMPLANT
DRAPE PROXIMA HALF (DRAPES) ×5 IMPLANT
DRAPE SURG 17X23 STRL (DRAPES) ×12 IMPLANT
ELECT BLADE 4.0 EZ CLEAN MEGAD (MISCELLANEOUS) ×3
ELECT REM PT RETURN 9FT ADLT (ELECTROSURGICAL) ×3
ELECTRODE BLDE 4.0 EZ CLN MEGD (MISCELLANEOUS) ×1 IMPLANT
ELECTRODE REM PT RTRN 9FT ADLT (ELECTROSURGICAL) ×1 IMPLANT
EVACUATOR 1/8 PVC DRAIN (DRAIN) IMPLANT
GAUZE SPONGE 4X4 16PLY XRAY LF (GAUZE/BANDAGES/DRESSINGS) ×3 IMPLANT
GLOVE BIO SURGEON STRL SZ8 (GLOVE) ×3 IMPLANT
GLOVE BIO SURGEON STRL SZ8.5 (GLOVE) ×8 IMPLANT
GLOVE BIOGEL PI IND STRL 7.0 (GLOVE) IMPLANT
GLOVE BIOGEL PI INDICATOR 7.0 (GLOVE) ×2
GLOVE EXAM NITRILE LRG STRL (GLOVE) IMPLANT
GLOVE EXAM NITRILE MD LF STRL (GLOVE) IMPLANT
GLOVE EXAM NITRILE XL STR (GLOVE) IMPLANT
GLOVE EXAM NITRILE XS STR PU (GLOVE) IMPLANT
GLOVE SS BIOGEL STRL SZ 8 (GLOVE) ×2 IMPLANT
GLOVE SUPERSENSE BIOGEL SZ 8 (GLOVE) ×6
GLOVE SURG SS PI 7.0 STRL IVOR (GLOVE) ×6 IMPLANT
GOWN STRL REUS W/ TWL LRG LVL3 (GOWN DISPOSABLE) ×1 IMPLANT
GOWN STRL REUS W/ TWL XL LVL3 (GOWN DISPOSABLE) ×3 IMPLANT
GOWN STRL REUS W/TWL 2XL LVL3 (GOWN DISPOSABLE) IMPLANT
GOWN STRL REUS W/TWL LRG LVL3 (GOWN DISPOSABLE) ×3
GOWN STRL REUS W/TWL XL LVL3 (GOWN DISPOSABLE) ×9
KIT BASIN OR (CUSTOM PROCEDURE TRAY) ×3 IMPLANT
KIT ROOM TURNOVER OR (KITS) ×3 IMPLANT
NDL HYPO 21X1.5 SAFETY (NEEDLE) IMPLANT
NEEDLE HYPO 21X1.5 SAFETY (NEEDLE) IMPLANT
NEEDLE HYPO 22GX1.5 SAFETY (NEEDLE) ×3 IMPLANT
NS IRRIG 1000ML POUR BTL (IV SOLUTION) ×3 IMPLANT
PACK FOAM VITOSS 10CC (Orthopedic Implant) ×2 IMPLANT
PACK LAMINECTOMY NEURO (CUSTOM PROCEDURE TRAY) ×3 IMPLANT
PAD ARMBOARD 7.5X6 YLW CONV (MISCELLANEOUS) ×9 IMPLANT
PATTIES SURGICAL .5 X1 (DISPOSABLE) IMPLANT
PUTTY 10ML ACTIFUSE ABX (Putty) ×2 IMPLANT
ROD REVERE CURVED 6.35X35MM (Rod) ×4 IMPLANT
SCREW REVERE 6.35 6.5X40MM (Screw) ×4 IMPLANT
SCREW REVERE 6.5X50MM (Screw) ×4 IMPLANT
SPACER SUSTAIN O SM 8X22 10M (Spacer) ×6 IMPLANT
SPONGE GAUZE 4X4 12PLY (GAUZE/BANDAGES/DRESSINGS) ×3 IMPLANT
SPONGE LAP 4X18 X RAY DECT (DISPOSABLE) IMPLANT
SPONGE NEURO XRAY DETECT 1X3 (DISPOSABLE) IMPLANT
SPONGE SURGIFOAM ABS GEL 100 (HEMOSTASIS) ×3 IMPLANT
STRIP CLOSURE SKIN 1/2X4 (GAUZE/BANDAGES/DRESSINGS) ×2 IMPLANT
SUT VIC AB 1 CT1 18XBRD ANBCTR (SUTURE) ×2 IMPLANT
SUT VIC AB 1 CT1 8-18 (SUTURE) ×6
SUT VIC AB 2-0 CP2 18 (SUTURE) ×6 IMPLANT
SYR 20CC LL (SYRINGE) IMPLANT
SYR 20ML ECCENTRIC (SYRINGE) ×3 IMPLANT
TOWEL OR 17X24 6PK STRL BLUE (TOWEL DISPOSABLE) ×3 IMPLANT
TOWEL OR 17X26 10 PK STRL BLUE (TOWEL DISPOSABLE) ×3 IMPLANT
TRAY FOLEY CATH 14FRSI W/METER (CATHETERS) ×3 IMPLANT
WATER STERILE IRR 1000ML POUR (IV SOLUTION) ×3 IMPLANT

## 2013-10-15 NOTE — Anesthesia Preprocedure Evaluation (Addendum)
Anesthesia Evaluation  Patient identified by MRN, date of birth, ID band Patient awake    Reviewed: Allergy & Precautions, H&P , Patient's Chart, lab work & pertinent test results  Airway Mallampati: I TM Distance: >3 FB Neck ROM: Full    Dental  (+) Teeth Intact, Dental Advisory Given   Pulmonary          Cardiovascular  Fibromuscular dysplasia- carotid arteries affected. On Pradaxa.  Stopped 2 weeks ago.  CT angio 04/2013 IMPRESSION: 1. 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 2. Unchanged, slightly decreased caliber of the left intracranial ICA compared to the right without focal stenosis.    Neuro/Psych  Headaches (complex migraines.  On Keppra for migraines.  Right hemiparesis and loss o/slurred speech during severe migraines),    GI/Hepatic GERD- (prn tums)  Medicated and Controlled,  Endo/Other    Renal/GU      Musculoskeletal   Abdominal   Peds  Hematology   Anesthesia Other Findings   Reproductive/Obstetrics                         Anesthesia Physical Anesthesia Plan  ASA: III  Anesthesia Plan: General   Post-op Pain Management:    Induction: Intravenous  Airway Management Planned: Oral ETT  Additional Equipment:   Intra-op Plan:   Post-operative Plan:   Informed Consent: I have reviewed the patients History and Physical, chart, labs and discussed the procedure including the risks, benefits and alternatives for the proposed anesthesia with the patient or authorized representative who has indicated his/her understanding and acceptance.     Plan Discussed with: CRNA and Surgeon  Anesthesia Plan Comments:         Anesthesia Quick Evaluation

## 2013-10-15 NOTE — Op Note (Signed)
Brief history: The patient is a 48 year old white female who has complained of back and leg pain consistent with a lumbar radiculopathy. She has failed medical management and was worked up with a lumbar x-rays a lumbar MRI. This demonstrated a grade 1/2 spondylolisthesis at L5-S1 with foraminal stenosis. I discussed the various treatment options with her including surgery. She has weighed the risks, benefits, and alternatives surgery and decided proceed with an L5-S1 decompression, instrumentation, and fusion.  Preoperative diagnosis: L5 spondylolyses, L5-S1 spondylolisthesis, Degenerative disc disease, spinal stenosis compressing both the L5 and the S1 nerve roots; lumbago; lumbar radiculopathy  Postoperative diagnosis: The same  Procedure: L5 Gill procedure/foraminotomies to decompress the bilateral L5 and S1 nerve roots(the work required to do this was in addition to the work required to do the posterior lumbar interbody fusion because of the patient's spinal stenosis, facet arthropathy. Etc. requiring a wide decompression of the nerve roots.); L5 S1 posterior lumbar interbody fusion with local morselized autograft bone and Actifusebone graft extender; insertion of interbody prosthesis at L5-S1 (globus peek interbody prosthesis); posterior nonsegmental instrumentation from L5 to S1 with globus titanium pedicle screws and rods; posterior lateral arthrodesis at L5-S1 with local morselized autograft bone and Vitoss bone graft extender.  Surgeon: Dr. Delma OfficerJeff Jenkins  Asst.: Dr. Marikay Alaravid Jones  Anesthesia: Gen. endotracheal  Estimated blood loss: 200 cc  Drains: One medium Hemovac  Complications: None  Description of procedure: The patient was brought to the operating room by the anesthesia team. General endotracheal anesthesia was induced. The patient was turned to the prone position on the Wilson frame. The patient's lumbosacral region was then prepared with Betadine scrub and Betadine solution.  Sterile drapes were applied.  I then injected the area to be incised with Marcaine with epinephrine solution. I then used the scalpel to make a linear midline incision over the 5 S1 interspace. I then used electrocautery to perform a bilateral subperiosteal dissection exposing the spinous process and lamina of L4, L5 and the upper sacrum. We then obtained intraoperative radiograph to confirm our location. We then inserted the Verstrac retractor to provide exposure. I used a scalpel to incise the interspinous ligament at L4-5 and L5-S1. I then used a Leksell nodule were to remove the spinous process of L5 and the part of the lamina at L5.  I began the decompression by using the high speed drill to perform laminotomies at L5 bilaterally. We then used the Kerrison punches to complete the L5 laminectomy/Gill procedure and removed the ligamentum flavum at L4-5 and L5-S1. We used the Kerrison punches to remove the medial facets at L5-S1. We performed wide foraminotomies about the bilateral L5 and S1 nerve roots completing the decompression.  We now turned our attention to the posterior lumbar interbody fusion. I used a scalpel to incise the intervertebral disc at L5-S1. I then performed a partial intervertebral discectomy at L5-S1 using the pituitary forceps. We prepared the vertebral endplates at L5-S1 for the fusion by removing the soft tissues with the curettes. We then used the trial spacers to pick the appropriate sized interbody prosthesis. We prefilled his prosthesis with a combination of local morselized autograft bone that we obtained during the decompression as well as Actifuse bone graft extender. We inserted the prefilled prosthesis into the interspace at L5-S1. There was a good snug fit of the prosthesis in the interspace. We then filled and the remainder of the intervertebral disc space with local morselized autograft bone and Actifuse. This completed the posterior lumbar interbody arthrodesis.  We  now turned attention to the instrumentation. Under fluoroscopic guidance we cannulated the bilateral L5 and S1 pedicles with the bone probe. We then removed the bone probe. We then tapped the pedicle with a 5.5 millimeter tap. We then removed the tap. We probed inside the tapped pedicle with a ball probe to rule out cortical breaches. We then inserted a 6.5 x 50 and 40 millimeter pedicle screw into the L5 and S1 pedicles bilaterally under fluoroscopic guidance. We then palpated along the medial aspect of the pedicles to rule out cortical breaches. There were none. The nerve roots were not injured. We then connected the unilateral pedicle screws with a lordotic rod. We compressed the construct and secured the rod in place with the caps. We then tightened the caps appropriately. This completed the instrumentation from L5-S1.  We now turned our attention to the posterior lateral arthrodesis at L5-S1. We used the high-speed drill to decorticate the remainder of the facets, pars, transverse process at L5-S1. We then applied a combination of local morselized autograft bone and Vitoss bone graft extender over these decorticated posterior lateral structures. This completed the posterior lateral arthrodesis.  We then obtained hemostasis using bipolar electrocautery. We irrigated the wound out with bacitracin solution. We inspected the thecal sac and nerve roots and noted they were well decompressed. We then removed the retractor. We placed a medium Hemovac drain in the epidural space and tunneled out through separate stab wound. We reapproximated patient's thoracolumbar fascia with interrupted #1 Vicryl suture. We reapproximated patient's subcutaneous tissue with interrupted 2-0 Vicryl suture. The reapproximated patient's skin with Steri-Strips and benzoin. The wound was then coated with bacitracin ointment. A sterile dressing was applied. The drapes were removed. The patient was subsequently returned to the supine  position where they were extubated by the anesthesia team. He was then transported to the post anesthesia care unit in stable condition. All sponge instrument and needle counts were reportedly correct at the end of this case.

## 2013-10-15 NOTE — Transfer of Care (Signed)
Immediate Anesthesia Transfer of Care Note  Patient: Julie Stone  Procedure(s) Performed: Procedure(s): POSTERIOR LUMBAR FUSION 1 LEVEL with laminectomy .lumbar five sacral one. Lumbar interbody prothesis and posterior lateral arthrodesis with non segmental instrumentation (N/A)  Patient Location: PACU  Anesthesia Type:General  Level of Consciousness: awake and alert   Airway & Oxygen Therapy: Patient Spontanous Breathing and Patient connected to face mask oxygen  Post-op Assessment: Report given to PACU RN and Post -op Vital signs reviewed and stable  Post vital signs: Reviewed and stable  Complications: No apparent anesthesia complications

## 2013-10-15 NOTE — Progress Notes (Signed)
Patient arrived from to room from PACU via stretcher, alert and oriented. Safety precautions and orders reviewed with patient. Call light within reach. Alarm activated. Bed on lowest position. Will continue to monitor.

## 2013-10-15 NOTE — H&P (Signed)
Subjective: The patient is a 48 year old white female who has complained of back and leg pain. She has failed medical management and was worked up with a lumbar x-rays a lumbar MRI this demonstrated an L5-S1 spondylolisthesis and spinal stenosis. I discussed the various treatment options with patient including surgery. She has weighed the risks, benefits, and alternatives surgery and decided proceed with an L5-S1 decompression, instrumentation, and fusion.   Past Medical History  Diagnosis Date  . Carotid artery occlusion   . 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 Depression   . GERD (gastroesophageal reflux disease)   . Migraines     4/week - does have botox injections  . Hx of blood clots     Pt had blood clot in right thumb    Past Surgical History  Procedure Laterality Date  . Abdominal hysterectomy    . Foot surgery Right   . Wrist surgery Right   . Cesarean section      Allergies  Allergen Reactions  . Chlorhexidine Gluconate Itching  . Darvon [Propoxyphene Hcl] Nausea And Vomiting and Other (See Comments)    Passed out  . Morphine And Related Itching  . Sulfa Antibiotics Swelling    History  Substance Use Topics  . Smoking status: Never Smoker   . Smokeless tobacco: Never Used  . Alcohol Use: 1.2 oz/week    2 Glasses of wine per week    Family History  Problem Relation Age of Onset  . Hypertension Mother   . Migraines Father    Prior to Admission medications   Medication Sig Start Date End Date Taking? Authorizing Emireth Cockerham  aspirin-acetaminophen-caffeine (EXCEDRIN MIGRAINE) [id number] MG per tablet Take 2 tablets by mouth every 4 (four) hours as needed for migraine.    Yes Historical Tiffany Calmes, MD  buPROPion (WELLBUTRIN XL) 300 MG 24 hr tablet Take 300 mg by mouth daily.   Yes Historical Ezmeralda Stefanick, MD  dabigatran (PRADAXA) 150 MG CAPS capsule Take 150 mg by mouth 2 (two) times daily.   Yes Historical Loreley Schwall, MD  diphenhydrAMINE  (BENADRYL) 25 MG tablet Take 25-50 mg by mouth at bedtime as needed for sleep.   Yes Historical Amarise Lillo, MD  donepezil (ARICEPT) 10 MG tablet Take 10 mg by mouth daily.   Yes Historical Hans Rusher, MD  levETIRAcetam (KEPPRA) 250 MG tablet Take 250 mg by mouth at bedtime.    Yes Historical Peter Keyworth, MD  Melatonin 5 MG TABS Take 5-10 mg by mouth at bedtime.   Yes Historical Kishana Battey, MD  mometasone (NASONEX) 50 MCG/ACT nasal spray Place 2 sprays into both nostrils at bedtime as needed (congestion).   Yes Historical Alasia Enge, MD  Multiple Vitamin (MULTIVITAMIN WITH MINERALS) TABS tablet Take 1 tablet by mouth daily.   Yes Historical Jill Ruppe, MD  OnabotulinumtoxinA (BOTOX IJ) Inject as directed. Every 3 months at Dr. Mickie Kayavid [place]   Yes Historical Cahlil Sattar, MD  PRESCRIPTION MEDICATION Apply 1 application topically every 3 (three) hours as needed (migraines). Promethazine topical compound ([facility])   Yes Historical Mckenze Slone, MD  tiZANidine (ZANAFLEX) 4 MG tablet Take 4 mg by mouth 2 (two) times daily as needed (migraines).  04/16/12  Yes Historical Emari Hreha, MD  traMADol (ULTRAM) 50 MG tablet Take 50-100 mg by mouth at bedtime as needed (pain).   Yes Historical Ryn Peine, MD  venlafaxine XR (EFFEXOR-XR) 150 MG 24 hr capsule Take 300 mg by mouth daily.   Yes Historical Owin Vignola, MD  Review of Systems  Positive ROS: As above  All other systems have been reviewed and were otherwise negative with the exception of those mentioned in the HPI and as above.  Objective: Vital signs in last 24 hours: Temp:  [97.5 F (36.4 C)] 97.5 F (36.4 C) (06/24 0627) Pulse Rate:  [71] 71 (06/24 0627) Resp:  [18] 18 (06/24 0627) BP: (138)/(67) 138/67 mmHg (06/24 0627) SpO2:  [100 %] 100 % (06/24 0627) Weight:  [63.504 kg (140 lb)] 63.504 kg (140 lb) (06/24 0627)  General Appearance: Alert, cooperative, no distress, Head: Normocephalic, without obvious abnormality,  atraumatic Eyes: PERRL, conjunctiva/corneas clear, EOM's intact,    Ears: Normal  Throat: Normal  Neck: Supple, symmetrical, trachea midline, no adenopathy; thyroid: No enlargement/tenderness/nodules; no carotid bruit or JVD Back: Symmetric, no curvature, ROM normal, no CVA tenderness Lungs: Clear to auscultation bilaterally, respirations unlabored Heart: Regular rate and rhythm, no murmur, rub or gallop Abdomen: Soft, non-tender,, no masses, no organomegaly Extremities: Extremities normal, atraumatic, no cyanosis or edema Pulses: 2+ and symmetric all extremities Skin: Skin color, texture, turgor normal, no rashes or lesions  NEUROLOGIC:   Mental status: alert and oriented, no aphasia, good attention span, Fund of knowledge/ memory ok Motor Exam - grossly normal Sensory Exam - grossly normal Reflexes:  Coordination - grossly normal Gait - grossly normal Balance - grossly normal Cranial Nerves: I: smell Not tested  II: visual acuity  OS: Normal  OD: Normal   II: visual fields Full to confrontation  II: pupils Equal, round, reactive to light  III,VII: ptosis None  III,IV,VI: extraocular muscles  Full ROM  V: mastication Normal  V: facial light touch sensation  Normal  V,VII: corneal reflex  Present  VII: facial muscle function - upper  Normal  VII: facial muscle function - lower Normal  VIII: hearing Not tested  IX: soft palate elevation  Normal  IX,X: gag reflex Present  XI: trapezius strength  5/5  XI: sternocleidomastoid strength 5/5  XI: neck flexion strength  5/5  XII: tongue strength  Normal    Data Review Lab Results  Component Value Date   WBC 5.7 10/06/2013   HGB 11.2* 10/06/2013   HCT 33.1* 10/06/2013   MCV 97.4 10/06/2013   PLT 300 10/06/2013   Lab Results  Component Value Date   NA 133* 10/06/2013   K 4.4 10/06/2013   CL 95* 10/06/2013   CO2 28 10/06/2013   BUN 12 10/06/2013   CREATININE 0.68 10/06/2013   GLUCOSE 94 10/06/2013   Lab Results  Component  Value Date   INR 1.00 10/06/2013   PROTIME 49.2* 07/08/2012    Assessment/Plan: L5-S1 spondylolisthesis, spinal stenosis, disc degeneration, lumbago, lumbar radiculopathy, neurogenic claudication: I have discussed situation with the patient. I have reviewed her imaging studies with her and pointed out the abnormalities. We have discussed the various treatment options including surgery. I have described the surgical treatment option of an L5-S1 decompression, instrumentation, and fusion. I have shown her surgical models. We have discussed the risks, benefits, alternatives, and likelihood of achieving our goals with surgery. I have answered all the patient's questions. She has decided proceed with surgery.   JENKINS,JEFFREY D 10/15/2013 7:57 AM

## 2013-10-15 NOTE — Progress Notes (Signed)
UR complete.  Courtney Robarge RN, MSN 

## 2013-10-15 NOTE — Anesthesia Postprocedure Evaluation (Signed)
Anesthesia Post Note  Patient: Julie Stone  Procedure(s) Performed: Procedure(s) (LRB): POSTERIOR LUMBAR FUSION 1 LEVEL with laminectomy .lumbar five sacral one. Lumbar interbody prothesis and posterior lateral arthrodesis with non segmental instrumentation (N/A)  Anesthesia type: general  Patient location: PACU  Post pain: Pain level controlled  Post assessment: Patient's Cardiovascular Status Stable  Last Vitals:  Filed Vitals:   10/15/13 1409  BP: 100/43  Pulse: 79  Temp:   Resp: 18    Post vital signs: Reviewed and stable  Level of consciousness: sedated  Complications: No apparent anesthesia complications

## 2013-10-15 NOTE — Progress Notes (Signed)
Pt falling asleep and having to be reminded to take deep breaths, states she is in pain, but falls back asleep

## 2013-10-15 NOTE — Progress Notes (Signed)
Pt. Ready for transport at 1300, Rn anc charge RN on floor unavailable for 30 mins.

## 2013-10-15 NOTE — Progress Notes (Signed)
Subjective:  The patient is somnolent but easily arousable. She is in no apparent distress.  Objective: Vital signs in last 24 hours: Temp:  [97.5 F (36.4 C)-98.1 F (36.7 C)] 98.1 F (36.7 C) (06/24 1223) Pulse Rate:  [71-109] 94 (06/24 1232) Resp:  [6-22] 6 (06/24 1232) BP: (100-138)/(67-70) 100/70 mmHg (06/24 1221) SpO2:  [98 %-100 %] 98 % (06/24 1232) Weight:  [63.504 kg (140 lb)] 63.504 kg (140 lb) (06/24 0627)  Intake/Output from previous day:   Intake/Output this shift: Total I/O In: 2000 [I.V.:2000] Out: 615 [Urine:415; Blood:200]  Physical exam the patient, but arousable. She is moving her lower extremities well.  Lab Results: No results found for this basename: WBC, HGB, HCT, PLT,  in the last 72 hours BMET No results found for this basename: NA, K, CL, CO2, GLUCOSE, BUN, CREATININE, CALCIUM,  in the last 72 hours  Studies/Results: Dg Lumbar Spine 2-3 Views  10/15/2013   CLINICAL DATA:  fusion, L5 pars defects  EXAM: LUMBAR SPINE - 1 VIEW; LUMBAR SPINE - 2-3 VIEW; DG C-ARM 61-120 MIN  COMPARISON:  08/08/2013  FINDINGS: Three intraoperative fluoroscopic spot images document bilateral pedicles screw placement L5 and S1. Graft markers project in the L5-S1 interspace.  IMPRESSION: PLIF L5-S1   Electronically Signed   By: Oley Balmaniel  Hassell M.D.   On: 10/15/2013 12:05   Dg Lumbar Spine 1 View  10/15/2013   CLINICAL DATA:  fusion, L5 pars defects  EXAM: LUMBAR SPINE - 1 VIEW; LUMBAR SPINE - 2-3 VIEW; DG C-ARM 61-120 MIN  COMPARISON:  08/08/2013  FINDINGS: Three intraoperative fluoroscopic spot images document bilateral pedicles screw placement L5 and S1. Graft markers project in the L5-S1 interspace.  IMPRESSION: PLIF L5-S1   Electronically Signed   By: Oley Balmaniel  Hassell M.D.   On: 10/15/2013 12:05   Dg C-arm 1-60 Min  10/15/2013   CLINICAL DATA:  fusion, L5 pars defects  EXAM: LUMBAR SPINE - 1 VIEW; LUMBAR SPINE - 2-3 VIEW; DG C-ARM 61-120 MIN  COMPARISON:  08/08/2013  FINDINGS:  Three intraoperative fluoroscopic spot images document bilateral pedicles screw placement L5 and S1. Graft markers project in the L5-S1 interspace.  IMPRESSION: PLIF L5-S1   Electronically Signed   By: Oley Balmaniel  Hassell M.D.   On: 10/15/2013 12:05    Assessment/Plan: The patient is doing well.  LOS: 0 days     JENKINS,JEFFREY D 10/15/2013, 12:44 PM

## 2013-10-16 LAB — CBC
HCT: 27.4 % — ABNORMAL LOW (ref 36.0–46.0)
HEMOGLOBIN: 9.3 g/dL — AB (ref 12.0–15.0)
MCH: 32.5 pg (ref 26.0–34.0)
MCHC: 33.9 g/dL (ref 30.0–36.0)
MCV: 95.8 fL (ref 78.0–100.0)
Platelets: 218 10*3/uL (ref 150–400)
RBC: 2.86 MIL/uL — ABNORMAL LOW (ref 3.87–5.11)
RDW: 12.8 % (ref 11.5–15.5)
WBC: 7.1 10*3/uL (ref 4.0–10.5)

## 2013-10-16 LAB — BASIC METABOLIC PANEL
BUN: 9 mg/dL (ref 6–23)
CALCIUM: 7.9 mg/dL — AB (ref 8.4–10.5)
CO2: 27 mEq/L (ref 19–32)
Chloride: 85 mEq/L — ABNORMAL LOW (ref 96–112)
Creatinine, Ser: 0.55 mg/dL (ref 0.50–1.10)
GFR calc non Af Amer: >90 mL/min (ref >90)
GLUCOSE: 101 mg/dL — AB (ref 70–99)
Potassium: 4.1 mEq/L (ref 3.7–5.3)
SODIUM: 123 meq/L — AB (ref 137–147)

## 2013-10-16 MED ORDER — PROMETHAZINE HCL 25 MG PO TABS: 25.0000 mg | ORAL_TABLET | Freq: Four times a day (QID) | ORAL | Status: DC | PRN

## 2013-10-16 MED ORDER — BUTALBITAL-APAP-CAFFEINE 50-325-40 MG PO TABS
2.0000 | ORAL_TABLET | Freq: Once | ORAL | Status: AC
  Administered 2013-10-16: 2 via ORAL
  Filled 2013-10-16: qty 2

## 2013-10-16 MED FILL — Heparin Sodium (Porcine) Inj 1000 Unit/ML: INTRAMUSCULAR | Qty: 30 | Status: AC

## 2013-10-16 MED FILL — Sodium Chloride IV Soln 0.9%: INTRAVENOUS | Qty: 1000 | Status: AC

## 2013-10-16 NOTE — Progress Notes (Signed)
Patient is due to void at 1400. [facility] She is unable to void. Bladder scanned of 999ml. In and out cath of 1200 ml of yellow urine, no odor noted. Patient is due to void again at 2100. Will continue to monitor,   Sim BoastHavy, RN

## 2013-10-16 NOTE — Progress Notes (Signed)
Rehab Admissions Coordinator Note:  Patient was screened by Clois DupesBoyette, Barbara Godwin for appropriateness for an Inpatient Acute Rehab Consult per PT recommendation. At this time, we are recommending await further progress with therapy before determing rehab venue options. I will follow up tomorrow.Clois Dupes.  Boyette, Barbara Godwin 10/16/2013, 9:30 PM  I can be reached at [phone].

## 2013-10-16 NOTE — Evaluation (Addendum)
Physical Therapy Evaluation Patient Details Name: Julie Stone MRN: [id number] DOB: 02/06/1966 Today's Date: 10/16/2013   History of Present Illness  48 y.o. [facility] for elective L5/S1 decompression and fusion.  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    Clinical Impression  Pt is POD #1 s/p lumbar spine surgery.  She passed out in standing during the PT session today.  She was overall two person mod assist.  She may progress quickly, but at her current presentation she would be appropriate for CIR consult.  PT will continue to follow acutely and update d/c recs as she progresses.     Follow Up Recommendations CIR    Equipment Recommendations  Rolling walker with 5" wheels    Recommendations for Other Services Rehab consult;OT consult     Precautions / Restrictions Precautions Precautions: Fall;Back Precaution Booklet Issued: Yes (comment) Precaution Comments: pt passed out in standing on evaluation Required Braces or Orthoses: Spinal Brace Spinal Brace: Lumbar corset;Applied in sitting position      Mobility  Bed Mobility Overal bed mobility: Needs Assistance Bed Mobility: Sidelying to Sit;Sit to Sidelying   Sidelying to sit: Mod assist     Sit to sidelying: +2 for physical assistance;Total assist General bed mobility comments: Mod assist to get to sitting EOB.  Pt was already in sidelying.  Assist needed to support her trunk during transitions.  Verbal cues for log roll technique.  Pt passed out in standing and required two person total assist to position back in sidelying in bed.   Transfers Overall transfer level: Needs assistance Equipment used: Rolling walker (2 wheeled) Transfers: Sit to/from Stand Sit to Stand: +2 physical assistance;Mod assist         General transfer comment: Two person mod assist to get pt to standing with RW.  Assist  provided at trunk and to help stabilize RW.   Ambulation/Gait             General Gait Details: Not tested due to pt passed out in standing EOB.  Not ready for gait yet.          Balance Overall balance assessment: Needs assistance Sitting-balance support: Feet supported;Bilateral upper extremity supported Sitting balance-Leahy Scale: Poor Sitting balance - Comments: min assist EOB.  Postural control: Posterior lean Standing balance support: Bilateral upper extremity supported Standing balance-Leahy Scale: Poor Standing balance comment: Two person mod assit with RW to stand.  Pt unable to stand for >20 seconds due to lightheadedness and then she passed out.                              Pertinent Vitals/Pain See vitals flow sheet.     Home Living Family/patient expects to be discharged to:: Private residence Living Arrangements: Spouse/significant other (two daughters home from college) Available Help at Discharge: Family;Available 24 hours/day Type of Home: House Home Access: Stairs to enter Entrance Stairs-Rails: None Entrance Stairs-Number of Steps: 3 Home Layout: Two level Home Equipment: None (per pt report she has access to a RW)      Prior Function Level of Independence: Needs assistance   Gait / Transfers Assistance Needed: depending on the severity of her back pain she would need assist     Comments: still driving, but "not comfortably", does not currently work     Higher education careers adviserHand Dominance   Dominant  Hand: Right    Extremity/Trunk Assessment   Upper Extremity Assessment: Defer to OT evaluation           Lower Extremity Assessment: Generalized weakness      Cervical / Trunk Assessment: Normal  Communication   Communication: No difficulties  Cognition Arousal/Alertness: Lethargic;Suspect due to medications Behavior During Therapy: [facility] for tasks assessed/performed Overall Cognitive Status: Difficult to assess                       General Comments General comments (skin integrity, edema, etc.): Positioned back in sidelying and pt returned to her previous level of arousal, conversant.  RN made aware that pt did pass out in standing with PT.           Assessment/Plan    PT Assessment Patient needs continued PT services  PT Diagnosis Difficulty walking;Abnormality of gait;Generalized weakness;Acute pain;Altered mental status   PT Problem List Decreased strength;Decreased activity tolerance;Decreased balance;Decreased mobility;Decreased knowledge of use of DME;Decreased knowledge of precautions;Pain  PT Treatment Interventions DME instruction;Gait training;Stair training;Functional mobility training;Therapeutic activities;Balance training;Therapeutic exercise;Neuromuscular re-education;Cognitive remediation;Patient/family education;Modalities   PT Goals (Current goals can be found in the Care Plan section) Acute Rehab PT Goals Patient Stated Goal: none stated PT Goal Formulation: With family Time For Goal Achievement: 10/23/13 Potential to Achieve Goals: Good    Frequency Min 5X/week    End of Session Equipment Utilized During Treatment: Gait belt;Back brace Activity Tolerance: Patient limited by fatigue;Treatment limited secondary to medical complications (Comment) Patient left: in bed;with call bell/phone within reach;with bed alarm set;with family/visitor present Nurse Communication: Mobility status         Time: [id number] PT Time Calculation (min): 22 min   Charges:   PT Evaluation $Initial PT Evaluation Tier I: 1 Procedure PT Treatments $Therapeutic Activity: 8-22 mins     Rebecca B. Medendorp, PT, DPT [phone]   10/16/2013, 5:34 PM

## 2013-10-16 NOTE — Progress Notes (Signed)
Patient ID: Julie Stone, female   DOB: 01/12/1966, 48 y.o.   MRN: [id number] Subjective:  The patient complains of a migraine. She has a history of migraines. She requests Phenergan.  Objective: Vital signs in last 24 hours: Temp:  [96.8 F (36 C)-98.1 F (36.7 C)] 97.3 F (36.3 C) (06/25 0545) Pulse Rate:  [66-109] 72 (06/25 0545) Resp:  [6-27] 18 (06/25 0545) BP: (96-138)/(43-80) 121/58 mmHg (06/25 0545) SpO2:  [97 %-100 %] 99 % (06/25 0545)  Intake/Output from previous day: 06/24 0701 - 06/25 0700 In: 2543.8 [P.O.:240; I.V.:2253.8; IV Piggyback:50] Out: 1215 [Urine:815; Drains:200; Blood:200] Intake/Output this shift:    Physical exam patient is alert. She moving her lower extremity well. Her dressing is clean and dry.  Lab Results:  Recent Labs  10/16/13 0433  WBC 7.1  HGB 9.3*  HCT 27.4*  PLT 218   BMET  Recent Labs  10/16/13 0433  NA 123*  K 4.1  CL 85*  CO2 27  GLUCOSE 101*  BUN 9  CREATININE 0.55  CALCIUM 7.9*    Studies/Results: Dg Lumbar Spine 2-3 Views  10/15/2013   CLINICAL DATA:  fusion, L5 pars defects  EXAM: LUMBAR SPINE - 1 VIEW; LUMBAR SPINE - 2-3 VIEW; DG C-ARM 61-120 MIN  COMPARISON:  08/08/2013  FINDINGS: Three intraoperative fluoroscopic spot images document bilateral pedicles screw placement L5 and S1. Graft markers project in the L5-S1 interspace.  IMPRESSION: PLIF L5-S1   Electronically Signed   By: Oley Balmaniel  Hassell M.D.   On: 10/15/2013 12:05   Dg Lumbar Spine 1 View  10/15/2013   CLINICAL DATA:  fusion, L5 pars defects  EXAM: LUMBAR SPINE - 1 VIEW; LUMBAR SPINE - 2-3 VIEW; DG C-ARM 61-120 MIN  COMPARISON:  08/08/2013  FINDINGS: Three intraoperative fluoroscopic spot images document bilateral pedicles screw placement L5 and S1. Graft markers project in the L5-S1 interspace.  IMPRESSION: PLIF L5-S1   Electronically Signed   By: Oley Balmaniel  Hassell M.D.   On: 10/15/2013 12:05   Dg C-arm 1-60 Min  10/15/2013   CLINICAL DATA:  fusion, L5  pars defects  EXAM: LUMBAR SPINE - 1 VIEW; LUMBAR SPINE - 2-3 VIEW; DG C-ARM 61-120 MIN  COMPARISON:  08/08/2013  FINDINGS: Three intraoperative fluoroscopic spot images document bilateral pedicles screw placement L5 and S1. Graft markers project in the L5-S1 interspace.  IMPRESSION: PLIF L5-S1   Electronically Signed   By: Oley Balmaniel  Hassell M.D.   On: 10/15/2013 12:05    Assessment/Plan: Postop day 1: The patient is doing well except for migraine. I'll add Phenergan as requested.  LOS: 1 day     JENKINS,JEFFREY D 10/16/2013, 7:54 AM

## 2013-10-16 NOTE — Progress Notes (Signed)
Pt voided at 17:30 using BSC.

## 2013-10-17 NOTE — Progress Notes (Signed)
Noted pt made progress with therapy that P.T. now is [facility] Will not need an inpt rehab consult for assessment for admission at this time. [phone]

## 2013-10-17 NOTE — Progress Notes (Signed)
Patient ID: Julie Stone, female   DOB: 06/12/1965, 48 y.o.   MRN: [id number] Subjective:  The patient is alert and pleasant. She looks much better today. I spoke with her husband.  Objective: Vital signs in last 24 hours: Temp:  [98.4 F (36.9 C)-99.3 F (37.4 C)] 98.4 F (36.9 C) (06/26 0627) Pulse Rate:  [67-88] 67 (06/26 0627) Resp:  [16-18] 18 (06/26 0627) BP: (84-107)/(46-55) 106/55 mmHg (06/26 0627) SpO2:  [95 %-99 %] 99 % (06/26 0627)  Intake/Output from previous day: 06/25 0701 - 06/26 0700 In: 1895 [P.O.:360; I.V.:1535] Out: 2050 [Urine:2000; Drains:50] Intake/Output this shift:    Physical exam patient is alert and oriented. Her strength is normal in her lower extremities.  Lab Results:  Recent Labs  10/16/13 0433  WBC 7.1  HGB 9.3*  HCT 27.4*  PLT 218   BMET  Recent Labs  10/16/13 0433  NA 123*  K 4.1  CL 85*  CO2 27  GLUCOSE 101*  BUN 9  CREATININE 0.55  CALCIUM 7.9*    Studies/Results: Dg Lumbar Spine 2-3 Views  10/15/2013   CLINICAL DATA:  fusion, L5 pars defects  EXAM: LUMBAR SPINE - 1 VIEW; LUMBAR SPINE - 2-3 VIEW; DG C-ARM 61-120 MIN  COMPARISON:  08/08/2013  FINDINGS: Three intraoperative fluoroscopic spot images document bilateral pedicles screw placement L5 and S1. Graft markers project in the L5-S1 interspace.  IMPRESSION: PLIF L5-S1   Electronically Signed   By: Oley Balmaniel  Hassell M.D.   On: 10/15/2013 12:05   Dg Lumbar Spine 1 View  10/15/2013   CLINICAL DATA:  fusion, L5 pars defects  EXAM: LUMBAR SPINE - 1 VIEW; LUMBAR SPINE - 2-3 VIEW; DG C-ARM 61-120 MIN  COMPARISON:  08/08/2013  FINDINGS: Three intraoperative fluoroscopic spot images document bilateral pedicles screw placement L5 and S1. Graft markers project in the L5-S1 interspace.  IMPRESSION: PLIF L5-S1   Electronically Signed   By: Oley Balmaniel  Hassell M.D.   On: 10/15/2013 12:05   Dg C-arm 1-60 Min  10/15/2013   CLINICAL DATA:  fusion, L5 pars defects  EXAM: LUMBAR SPINE - 1 VIEW;  LUMBAR SPINE - 2-3 VIEW; DG C-ARM 61-120 MIN  COMPARISON:  08/08/2013  FINDINGS: Three intraoperative fluoroscopic spot images document bilateral pedicles screw placement L5 and S1. Graft markers project in the L5-S1 interspace.  IMPRESSION: PLIF L5-S1   Electronically Signed   By: Oley Balmaniel  Hassell M.D.   On: 10/15/2013 12:05    Assessment/Plan: Postop day #2: The patient is doing much better today. We will mobilize her with PT. She will likely go home tomorrow.  LOS: 2 days     JENKINS,JEFFREY D 10/17/2013, 10:23 AM

## 2013-10-17 NOTE — Progress Notes (Signed)
Physical Therapy Treatment Patient Details Name: Julie Stone MRN: [id number] DOB: 09/29/1965 Today's Date: 10/17/2013    History of Present Illness 48 [facility].o. [facility] for elective L5/S1 decompression and fusion.  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      PT Comments    Pt is POD #2 and is progressing well with her mobility today. No signs of lightheadedness like yesterday.  She will likely now progress well enough to go home with HHPT and family's support.  I would still be cautious with her as her BPs have been soft and she did pass out on evaluation with PT.  Pt will need to start stair training as she has a flight to access her bedroom at home. PT will continue to follow acutely.   Follow Up Recommendations  Home health PT;Supervision for mobility/OOB     Equipment Recommendations  Rolling walker with 5" wheels    Recommendations for Other Services OT consult     Precautions / Restrictions Precautions Precautions: Fall;Back Precaution Booklet Issued: Yes (comment) Precaution Comments: Pt able to report 2/3 back precautions.  She does not remember learning them yesterday.  Reoriented her to the handout.  Required Braces or Orthoses: Spinal Brace Spinal Brace: Lumbar corset;Applied in sitting position    Mobility  Bed Mobility Overal bed mobility: Needs Assistance Bed Mobility: Rolling;Sidelying to Sit Rolling: Min assist Sidelying to sit: Mod assist       General bed mobility comments: Min assist to progress trunk all the way over to sidelying.  Mod assist to support trunk during transition to sitting.   Transfers Overall transfer level: Needs assistance Equipment used: Rolling walker (2 wheeled) Transfers: Sit to/from Stand Sit to Stand: Min assist         General transfer comment: Min assist to support trunk during transition to standing  over flexed knees.  Slow and painful transition.  Verbal cues for safe hand placement.   Ambulation/Gait Ambulation/Gait assistance: Min assist;+2 safety/equipment (chair to follow for safety due to yesterday's passing out ) Ambulation Distance (Feet): 150 Feet Assistive device: Rolling walker (2 wheeled) Gait Pattern/deviations: Step-through pattern;Shuffle;Trunk flexed Gait velocity: decreased Gait velocity interpretation: Below normal speed for age/gender General Gait Details: Verbal cues for upright posture, safe use of DME.           Balance Overall balance assessment: Needs assistance Sitting-balance support: Bilateral upper extremity supported;Feet supported;No upper extremity supported Sitting balance-Leahy Scale: Fair     Standing balance support: Bilateral upper extremity supported Standing balance-Leahy Scale: Poor                      Cognition Arousal/Alertness: Awake/alert Behavior During Therapy: WFL for tasks assessed/performed Overall Cognitive Status: Within Functional Limits for tasks assessed                             Pertinent Vitals/Pain See vitals flow sheet.            PT Goals (current goals can now be found in the care plan section) Acute Rehab PT Goals Patient Stated Goal: to do well enough to go home and make it up the flight of stairs to her bedroom.  Progress towards PT goals: Progressing toward goals    Frequency  Min 5X/week    PT Plan Discharge plan needs to be  updated       End of Session Equipment Utilized During Treatment: Gait belt;Back brace Activity Tolerance: Patient limited by pain Patient left: in chair;with call bell/phone within reach;with chair alarm set;with family/visitor present     Time: 1137-1205 PT Time Calculation (min): 28 min  Charges:  $Gait Training: 8-22 mins $Therapeutic Activity: 8-22 mins                      Rebecca B. Medendorp, PT, DPT [phone]   10/17/2013, 3:18  PM

## 2013-10-18 NOTE — Progress Notes (Signed)
Physical Therapy Treatment Patient Details Name: Julie Stone MRN: [id number] DOB: 02/16/1966 Today's Date: 10/18/2013    History of Present Illness 48 [facility].o. [facility] for elective L5/S1 decompression and fusion.  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      PT Comments    Pt making great progress with stair education completed today.  Follow Up Recommendations  Home health PT;Supervision for mobility/OOB     Equipment Recommendations  Rolling walker with 5" wheels    Recommendations for Other Services OT consult     Precautions / Restrictions Precautions Precautions: Fall;Back Precaution Comments: Pt able to report 2/3 back precautions. Re-educated her and daughter on all 3. Required Braces or Orthoses: Spinal Brace Spinal Brace: Lumbar corset;Applied in sitting position Restrictions Weight Bearing Restrictions: No    Mobility  Bed Mobility   Bed Mobility: Sit to Sidelying;Rolling Rolling: Min assist       Sit to sidelying: Min assist General bed mobility comments: with bed flat and no rails used: cues on technique. assist needed to clear bed surface with bil legs and to complete trunk roll onto back.  Transfers Overall transfer level: Modified independent Equipment used: Rolling walker (2 wheeled) Transfers: Sit to/from Stand Sit to Stand: Supervision         General transfer comment: cues on hand placement with transfers.  Ambulation/Gait Ambulation/Gait assistance: Supervision Ambulation Distance (Feet): 300 Feet Assistive device: Rolling walker (2 wheeled) Gait Pattern/deviations: Step-through pattern;Decreased stride length;Narrow base of support Gait velocity: decreased Gait velocity interpretation: Below normal speed for age/gender General Gait Details: Verbal cues for upright posture, safe use of DME.    Stairs Stairs:  Yes Stairs assistance: Min guard Stair Management: Step to pattern;Forwards;One rail Right Number of Stairs: 12 General stair comments: 3 with HHA and rest with right rail to educated on how to Advanced Micro Devicesnegoatiate both outdoor and indoor sets of stairs. Daughter present and educated on how to assist pt as well.   Wheelchair Mobility    Modified Rankin (Stroke Patients Only)          Cognition Arousal/Alertness: Awake/alert Behavior During Therapy: WFL for tasks assessed/performed Overall Cognitive Status: Within Functional Limits for tasks assessed                             PT Goals (current goals can now be found in the care plan section) Acute Rehab PT Goals Patient Stated Goal: to do well enough to go home and make it up the flight of stairs to her bedroom.  PT Goal Formulation: With family Time For Goal Achievement: 10/23/13 Potential to Achieve Goals: Good Progress towards PT goals: Progressing toward goals    Frequency  Min 5X/week    PT Plan Current plan remains appropriate       End of Session Equipment Utilized During Treatment: Gait belt;Back brace Activity Tolerance: Patient tolerated treatment well Patient left: in bed;with family/visitor present;with call bell/phone within reach     Time: 1355-1424 PT Time Calculation (min): 29 min  Charges:  $Gait Training: 23-37 mins                    G Codes:      Sallyanne KusterBury, Kathy 10/18/2013, 3:29 PM  Sallyanne KusterKathy Bury, PTA Office- [phone]

## 2013-10-18 NOTE — Progress Notes (Signed)
Patient ID: Julie Stone, female   DOB: 10/06/1965, 48 y.o.   MRN: [id number] Subjective:  The patient is alert and pleasant. She looks and feels a bit better. She wants to go home tomorrow.  Objective: Vital signs in last 24 hours: Temp:  [98.1 F (36.7 C)-98.7 F (37.1 C)] 98.5 F (36.9 C) (06/27 0519) Pulse Rate:  [59-86] 85 (06/27 0519) Resp:  [18] 18 (06/27 0519) BP: (89-124)/(47-82) 121/52 mmHg (06/27 0519) SpO2:  [95 %-100 %] 98 % (06/27 0519)  Intake/Output from previous day:   Intake/Output this shift:    Physical exam patient is alert and oriented. Her strength is normal in her lower extremities.  Lab Results:  Recent Labs  10/16/13 0433  WBC 7.1  HGB 9.3*  HCT 27.4*  PLT 218   BMET  Recent Labs  10/16/13 0433  NA 123*  K 4.1  CL 85*  CO2 27  GLUCOSE 101*  BUN 9  CREATININE 0.55  CALCIUM 7.9*    Studies/Results: No results found.  Assessment/Plan: Postop day #3: We will continue to mobilize her with PT. She will likely go home tomorrow.  LOS: 3 days     JENKINS,JEFFREY D 10/18/2013, 8:26 AM

## 2013-10-19 MED ORDER — DIAZEPAM 5 MG PO TABS: 5.0000 mg | ORAL_TABLET | Freq: Four times a day (QID) | ORAL | Status: DC | PRN

## 2013-10-19 MED ORDER — DSS 100 MG PO CAPS: 100.0000 mg | ORAL_CAPSULE | Freq: Two times a day (BID) | ORAL | Status: DC

## 2013-10-19 MED ORDER — OXYCODONE-ACETAMINOPHEN 10-325 MG PO TABS: 1.0000 | ORAL_TABLET | ORAL | Status: DC | PRN

## 2013-10-19 MED ORDER — DIPHENHYDRAMINE HCL 50 MG/ML IJ SOLN
25.0000 mg | Freq: Four times a day (QID) | INTRAMUSCULAR | Status: DC | PRN
  Administered 2013-10-19: 25 mg via INTRAVENOUS
  Filled 2013-10-19: qty 1

## 2013-10-19 NOTE — Evaluation (Addendum)
Occupational Therapy Evaluation Patient Details Name: Julie Stone MRN: [id number] DOB: 08/16/1965 Today's Date: 10/19/2013    History of Present Illness 48 y.o. female [facility] on 10/15/13 for elective L5/S1 decompression and fusion.  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     Clinical Impression   Pt presents with below problem list. Feel pt will benefit from acute OT to increase independence prior to d/c. Plan to go over tub transfer next session.     Follow Up Recommendations  No OT follow up;Supervision - Intermittent    Equipment Recommendations  3 in 1 bedside comode;Other (comment) (AE)    Recommendations for Other Services       Precautions / Restrictions Precautions Precautions: Fall;Back Precaution Booklet Issued:  (pt has one) Precaution Comments: Reviewed precautions Required Braces or Orthoses: Spinal Brace Spinal Brace: Lumbar corset;Applied in sitting position Restrictions Weight Bearing Restrictions: No      Mobility Bed Mobility Overal bed mobility: Needs Assistance Bed Mobility: Sidelying to Sit;Rolling Rolling: Supervision       Sit to sidelying: Supervision General bed mobility comments: cues for technique.  Transfers Overall transfer level: Needs assistance Equipment used: Rolling walker (2 wheeled) Transfers: Sit to/from Stand Sit to Stand: Supervision;Min guard         General transfer comment: cues for hand placement    Balance                                            ADL Overall ADL's : Needs assistance/impaired     Grooming: Brushing hair;Oral care;Standing;Set up;Supervision/safety           Upper Body Dressing : Sitting;Standing;Set up;Supervision/safety (daughter assisted but feel pt could do herself)   Lower Body Dressing: Sit to/from stand;With adaptive equipment;Minimal assistance    Toilet Transfer: Ambulation;Min guard;Grab bars;RW;Comfort height toilet;Supervision/safety    Toilet clothing manipulation/hygiene: Min guard; sit to/from stand       Functional mobility during ADLs: Rolling walker;Supervision/safety General ADL Comments: Educated on use of cup for teeth care and placement of grooming items. Pt appeared to be maintaining precautions while perfoming hygiene-cues for technique. Educated on AE for LB ADLs-pt practiced with reacher and sockaid. Did mention pt could use toilet aid if hygiene was issue. Educated on use of bag on walker  and safe shoewear as well as discussed rugs.  Briefly discussed tub transfer and 3 in 1.     Vision                     Perception     Praxis      Pertinent Vitals/Pain Pain 5-6/10. Pillows behind pt in chair.     Hand Dominance Right   Extremity/Trunk Assessment Upper Extremity Assessment Upper Extremity Assessment: Overall WFL for tasks assessed   Lower Extremity Assessment Lower Extremity Assessment: Defer to PT evaluation       Communication Communication Communication: No difficulties   Cognition Arousal/Alertness: Awake/alert Behavior During Therapy: WFL for tasks assessed/performed Overall Cognitive Status: Within Functional Limits for tasks assessed (talkative during session)                     General Comments       Exercises       Shoulder Instructions  Home Living Family/patient expects to be discharged to:: Private residence Living Arrangements: Spouse/significant other Available Help at Discharge: Family;Available 24 hours/day (children) Type of Home: House Home Access: Stairs to enter Entergy CorporationEntrance Stairs-Number of Steps: 3 Entrance Stairs-Rails: None Home Layout: Two level Alternate Level Stairs-Number of Steps: flight Alternate Level Stairs-Rails: Left Bathroom Shower/Tub: Chief Strategy OfficerTub/shower unit   Bathroom Toilet: Standard     Home Equipment:  (per pt has access to  RW; borrowing toilet riser)          Prior Functioning/Environment Level of Independence: Needs assistance  Gait / Transfers Assistance Needed: depending on the severity of her back pain she would need assist     Comments: still driving, but "not comfortably", does not currently work    OT Diagnosis: Acute pain   OT Problem List: Decreased range of motion;Decreased knowledge of use of DME or AE;Decreased knowledge of precautions;Pain   OT Treatment/Interventions: Self-care/ADL training;DME and/or AE instruction;Therapeutic activities;Patient/family education;Balance training    OT Goals(Current goals can be found in the care plan section) Acute Rehab OT Goals Patient Stated Goal: not stated OT Goal Formulation: With patient Time For Goal Achievement: 10/26/13 Potential to Achieve Goals: Good ADL Goals Pt Will Perform Lower Body Dressing: with modified independence;sit to/from stand;with adaptive equipment Pt Will Perform Tub/Shower Transfer: Tub transfer;with supervision;ambulating;rolling walker;3 in 1  OT Frequency: Min 2X/week   Barriers to D/C:            Co-evaluation              End of Session Equipment Utilized During Treatment: Gait belt;Rolling walker;Back brace Nurse Communication: Mobility status; pain level  Activity Tolerance: Patient tolerated treatment well Patient left: in chair;with call bell/phone within reach;with family/visitor present   Time: 1026-1101 OT Time Calculation (min): 35 min Charges:  OT General Charges $OT Visit: 1 Procedure OT Evaluation $Initial OT Evaluation Tier I: 1 Procedure OT Treatments $Self Care/Home Management : 8-22 mins G-CodesEarlie Raveling:    Straub, Julie Stone [phone] 10/19/2013, 11:18 AM

## 2013-10-19 NOTE — Progress Notes (Signed)
Occupational Therapy Treatment Patient Details Name: Julie Stone MRN: [id number] DOB: 09/03/1965 Today's Date: 10/19/2013    History of present illness 48 y.o. female admitted [facility] 10/[facility]13 for elective L5/S1 decompression and fusion.  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     OT comments  Educated on tub transfer techniques. Other daughter present during session for education. Plan to review next session on AE and practice bed mobility.  Follow Up Recommendations  No OT follow up;Supervision - Intermittent    Equipment Recommendations  3 in 1 bedside comode;Other (comment) (AE)    Recommendations for Other Services      Precautions / Restrictions Precautions Precautions: Fall;Back Precaution Comments: Reviewed precautions throughout session Required Braces or Orthoses: Spinal Brace Spinal Brace: Lumbar corset;Applied in sitting position Restrictions Weight Bearing Restrictions: No       Mobility Bed Mobility Overal bed mobility: Needs Assistance Bed Mobility: Rolling;Sit to Sidelying Rolling: Mod assist       Sit to sidelying: Min assist General bed mobility comments: Assistance with LE when going to sidelying position.  Pillow placed between legs as pt decided to sleep in sidelying.  Transfers Overall transfer level: Needs assistance Equipment used: Rolling walker (2 wheeled) Transfers: Sit to/from Stand Sit to Stand: Min guard;Min assist         General transfer comment: cues for hand placement.    Balance                                   ADL Overall ADL's : Needs assistance/impaired                 Upper Body Dressing : Minimal assistance;Sitting;Standing (brace)       Toilet Transfer: Min guard;Ambulation;RW (3 in 1 over commode)   Toileting- Clothing Manipulation and Hygiene: Min guard;Sit to/from stand   Tub/ [facility]  Transfer: Moderate assistance;Shower seat;Rolling walker (did not fully complete)   Functional mobility during ADLs: Min guard;Rolling walker (cues to relax shoulders) General ADL Comments: Reviewed tips with other daughter during session (use of cup for teeth care, placement of grooming items, use of bag on walker). Educated on tub transfer technique options and different options for shower chair. Educated on back brace.  Pt also ambulated in hallway with OT.      Vision                     Perception     Praxis      Cognition   Behavior During Therapy: [facility] CenterWFL for tasks assessed/performed Overall Cognitive Status: Within Functional Limits for tasks assessed (VERY talkative-baseline-cues to stay on task)                       Extremity/Trunk Assessment               Exercises     Shoulder Instructions       General Comments      Pertinent Vitals/ Pain       Pain 7.5/10. Repositioned.  Home Living                                          Prior Functioning/Environment  Frequency Min 2X/week     Progress Toward Goals  OT Goals(current goals can now be found in the care plan section)  Progress towards OT goals: Progressing toward goals  Acute Rehab OT Goals Patient Stated Goal: not stated OT Goal Formulation: With patient Time For Goal Achievement: 10/26/13 Potential to Achieve Goals: Good ADL Goals Pt Will Perform Lower Body Dressing: with modified independence;sit to/from stand;with adaptive equipment Pt Will Transfer to Toilet: with modified independence;ambulating Pt Will Perform Tub/Shower Transfer: Tub transfer;with supervision;ambulating;rolling walker;3 in 1 Additional ADL Goal #1: Pt will perform bed mobility at supervision level as precursor for ADLs.  Plan Discharge plan remains appropriate    Co-evaluation                 End of Session Equipment Utilized During Treatment: Gait  belt;Rolling walker;Back brace   Activity Tolerance Patient tolerated treatment well   Patient Left in bed;with bed alarm set;with call bell/phone within reach;with family/visitor present   Nurse Communication          Time: 1630-1706 OT Time Calculation (min): 36 min  Charges: OT General Charges $OT Visit: 1 Procedure OT Treatments $Self Care/Home Management : 8-22 mins $Therapeutic Activity: 8-22 mins  Earlie RavelingStraub, Lindsey L OTR/L [phone] 10/19/2013, 5:57 PM

## 2013-10-19 NOTE — Discharge Summary (Signed)
  Physician Discharge Summary  Patient ID: Julie Stone MRN: [id number] DOB/AGE: 50/09/1965 50 y.o.  Admit date: 10/15/2013 Discharge date: 10/19/2013  Admission Diagnoses: L5-S1 spondylolisthesis, spinal stenosis, lumbago, lumbar radiculopathy , L5 spondylolyses  Discharge Diagnoses: The same Active Problems:   Spondylolisthesis of lumbar region   Discharged Condition: good  Hospital Course: I performed at L5-S1 decompression, instrumentation, and fusion on the patient on 10/15/2013. The surgery went well.  The patient's postoperative course was unremarkable. On postop day #4 the patient requested discharge to home. The patient, and her daughter, were given oral and written discharge instructions. All their questions were answered. The patient was instructed to resume her projects on 10/22/2013.  Consults: PT, OT Significant Diagnostic Studies: None Treatments: L5-S1 decompression, instrumentation, and fusion Discharge Exam: Blood pressure 125/65, pulse 72, temperature 98.3 F (36.8 C), temperature source Oral, resp. rate 19, height 5\' 3"  (1.6 m), weight 63.5 kg (139 lb 15.9 oz), SpO2 97.00%.  the patient is alert and pleasant. Her strength is normal.  Disposition: Home     Medication List    STOP taking these medications       dabigatran 150 MG Caps capsule  Commonly known as:  PRADAXA     tiZANidine 4 MG tablet  Commonly known as:  ZANAFLEX     traMADol 50 MG tablet  Commonly known as:  ULTRAM      TAKE these medications       aspirin-acetaminophen-caffeine 250-250-65 MG per tablet  Commonly known as:  EXCEDRIN MIGRAINE  Take 2 tablets by mouth every 4 (four) hours as needed for migraine.     BOTOX IJ  Inject as directed. Every 3 months at [place]. Mickie [place]id [place]     buPROPion 300 MG 24 hr tablet  Commonly known as:  WELLBUTRIN XL  Take 300 mg by mouth daily.     diazepam 5 MG tablet  Commonly known as:  VALIUM  Take 1 tablet (5 mg  total) by mouth every 6 (six) hours as needed for muscle spasms.     diphenhydrAMINE 25 MG tablet  Commonly known as:  BENADRYL  Take 25-50 mg by mouth at bedtime as needed for sleep.     donepezil 10 MG tablet  Commonly known as:  ARICEPT  Take 10 mg by mouth daily.     DSS 100 MG Caps  Take 100 mg by mouth 2 (two) times daily.     levETIRAcetam 250 MG tablet  Commonly known as:  KEPPRA  Take 250 mg by mouth at bedtime.     Melatonin 5 MG Tabs  Take 5-10 mg by mouth at bedtime.     mometasone 50 MCG/ACT nasal spray  Commonly known as:  NASONEX  Place 2 sprays into both nostrils at bedtime as needed (congestion).     multivitamin with minerals Tabs tablet  Take 1 tablet by mouth daily.     oxyCODONE-acetaminophen 10-325 MG per tablet  Commonly known as:  PERCOCET  Take 1 tablet by mouth every 4 (four) hours as needed for pain.     PRESCRIPTION MEDICATION  Apply 1 application topically every 3 (three) hours as needed (migraines). Promethazine topical compound ([facility])     venlafaxine XR 150 MG 24 hr capsule  Commonly known as:  EFFEXOR-XR  Take 300 mg by mouth daily.         SignedCristi Loron: JENKINS,JEFFREY D 10/19/2013, 9:13 AM

## 2013-10-20 NOTE — Progress Notes (Signed)
Pt A&O x4; pt discharge education and instructions completed with pt and family at bedside. All voices understanding and denies any questions. Pt IV removed; incision clean, dry and intact with steri-strips open to air. Pt pre-medicated prior to discharge. Pt handed her prescription for valium and percocet. Pt transported off unit via wheelchair with family and belongings at side. Pt discharge home with family to transport to disposition. Arabella MerlesP. Amo Raianna Slight RN.

## 2013-10-20 NOTE — Progress Notes (Addendum)
Occupational Therapy Treatment Patient Details Name: Julie Stone Basic MRN: [id number] DOB: 04/23/1966 Today's Date: 10/20/2013    History of present illness 48 y.o. female [facility]/13 for elective L5/S1 decompression and fusion.  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     OT comments  Session limited due to pain. Pt became light headed during session-Nurse notified.   Follow Up Recommendations  No OT follow up;Supervision - Intermittent    Equipment Recommendations  Other (comment);3 in 1 bedside comode (AE; unsure if pt has 3 in 1)    Recommendations for Other Services      Precautions / Restrictions Precautions Precautions: Fall;Back Precaution Comments: Reviewed precautions Required Braces or Orthoses: Spinal Brace Spinal Brace: Lumbar corset;Applied in sitting position Restrictions Weight Bearing Restrictions: No       Mobility Bed Mobility Overal bed mobility: Needs Assistance Bed Mobility: Rolling;Sidelying to Sit Rolling: Supervision Sidelying to sit: Min guard       General bed mobility comments: cues for technique.  Transfers Overall transfer level: Needs assistance Equipment used: Rolling walker (2 wheeled) Transfers: Sit to/from Stand Sit to Stand: Min guard         General transfer comment: Cues for technique.    Balance                                   ADL Overall ADL's : Needs assistance/impaired     Grooming: Brushing hair;Oral care;Min guard;Sitting;Standing           Upper Body Dressing : Minimal assistance;Standing;Sitting (OT helped adjust back brace)   Lower Body Dressing: Set up;Supervision/safety;Sitting/lateral leans (donning socks)   Toilet Transfer: Min guard;Ambulation;RW (3 in 1 over commode)   Toileting- Clothing Manipulation and Hygiene: Min guard;Sit to/from stand (cues for precautions)        Functional mobility during ADLs: Min guard;Rolling walker General ADL Comments: Pt had to urinate upon OT entering room. Educated on AE for LB dressing as pt is concerned with this. Pt practiced with sockaid. Pt did not practice donning pants with reacher due to pain. Pt unable to cross left leg over knee. Pt became lightheaded-OT reclined pt and elevated LE's.      Vision                     Perception     Praxis      Cognition   Behavior During Therapy: [facility] CenterWFL for tasks assessed/performed Overall Cognitive Status: Within Functional Limits for tasks assessed                       Extremity/Trunk Assessment               Exercises     Shoulder Instructions       General Comments      Pertinent Vitals/ Pain       Pain 9/10. Nurse notified for pain meds.  Home Living                                          Prior Functioning/Environment              Frequency Min 2X/week     Progress Toward Goals  OT Goals(current goals can now be found in the care plan section)     Acute Rehab OT Goals Patient Stated Goal: not stated OT Goal Formulation: With patient Time For Goal Achievement: 10/26/13 Potential to Achieve Goals: Good  Plan Discharge plan remains appropriate    Co-evaluation                 End of Session Equipment Utilized During Treatment: Gait belt;Rolling walker;Back brace   Activity Tolerance Patient limited by pain   Patient Left in chair;with call bell/phone within reach;with chair alarm set   Nurse Communication Mobility status;Other (comment) (lightheaded and pain )        Time: [id number] OT Time Calculation (min): 33 min  Charges: OT General Charges $OT Visit: 1 Procedure OT Treatments $Self Care/Home Management : 23-37 mins  Earlie RavelingStraub, Julie Stone OTR/Stone [phone] 10/20/2013, 8:41 AM

## 2013-10-20 NOTE — Progress Notes (Signed)
Physical Therapy Treatment Patient Details Name: Julie Stone MRN: [id number] DOB: 01/17/1966 Today's Date: 10/20/2013    History of Present Illness 48 y.o. female [facility] for elective L5/S1 decompression and fusion.  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      PT Comments    Patient mobilizing well with use of walker. Patient has good family support at home and is well enough to go home with supervision at discharge. Patient would benefit from HHPT.  Follow Up Recommendations  Home health PT;Supervision for mobility/OOB     Equipment Recommendations  Rolling walker with 5" wheels       Precautions / Restrictions Precautions Precautions: Fall;Back Precaution Comments: Reviewed precautions, patient was able to remember 3/3 back precautions. Required Braces or Orthoses: Spinal Brace Spinal Brace: Lumbar corset;Applied in sitting position Restrictions Weight Bearing Restrictions: No    Mobility  Bed Mobility Overal bed mobility: Needs Assistance Bed Mobility: Rolling;Sidelying to Sit Rolling: Supervision Sidelying to sit: Min guard     Sit to sidelying: Min assist General bed mobility comments: Min guard for patient safety for sidelying to sit and min assist with patients legs as moved from sit to sidelying  Transfers Overall transfer level: Needs assistance Equipment used: Rolling walker (2 wheeled) Transfers: Sit to/from Stand Sit to Stand: Min guard         General transfer comment: Cues to push off bed  Ambulation/Gait Ambulation/Gait assistance: Min guard Ambulation Distance (Feet): 125 Feet Assistive device: Rolling walker (2 wheeled) Gait Pattern/deviations: WFL(Within Functional Limits);Step-through pattern         Stairs Stairs: Yes Stairs assistance: Min guard Stair Management: One rail Left Number of Stairs: 12 General stair  comments: Step to pattern leading with right foot        Balance Overall balance assessment: Modified Independent Sitting-balance support: Single extremity supported;No upper extremity supported;Feet supported;Feet unsupported Sitting balance-Leahy Scale: Fair Sitting balance - Comments: Min guard for patient safety   Standing balance support: Bilateral upper extremity supported Standing balance-Leahy Scale: Fair Standing balance comment: Patient was able to maintain balance without support while washing her hands at the sink                    Cognition Arousal/Alertness: Awake/alert Behavior During Therapy: [facility] CenterWFL for tasks assessed/performed Overall Cognitive Status: Within Functional Limits for tasks assessed                             Pertinent Vitals/Pain See vitals flow sheet.            PT Goals (current goals can now be found in the care plan section) Acute Rehab PT Goals Patient Stated Goal: not stated PT Goal Formulation: With patient/family Time For Goal Achievement: 11/03/13 Potential to Achieve Goals: Good Progress towards PT goals: Progressing toward goals    Frequency  Min 5X/week    PT Plan Current plan remains appropriate       End of Session Equipment Utilized During Treatment: Gait belt;Back brace Activity Tolerance: Patient tolerated treatment well Patient left: in bed;with family/visitor present;with call bell/phone within reach     Time: 1318-1400 PT Time Calculation (min): 42 min  Charges:  $Gait Training: 8-22 mins $Therapeutic Activity: 23-37 mins  G CodesEber Stone, [place] [phone]

## 2013-10-20 NOTE — Discharge Summary (Signed)
  Subjective:  The patient is alert and pleasant. She wants to go home.  Objective: Vital signs in last 24 hours: Temp:  [97.6 F (36.4 C)-98.7 F (37.1 C)] 98.4 F (36.9 C) (06/29 1506) Pulse Rate:  [68-83] 68 (06/29 1506) Resp:  [18-20] 18 (06/29 1506) BP: (112-139)/(58-71) 130/71 mmHg (06/29 1506) SpO2:  [95 %-100 %] 97 % (06/29 1506)  Intake/Output from previous day:   Intake/Output this shift:    Physical exam the patient is alert and oriented. Her strength is normal.  Lab Results: No results found for this basename: WBC, HGB, HCT, PLT,  in the last 72 hours BMET No results found for this basename: NA, K, CL, CO2, GLUCOSE, BUN, CREATININE, CALCIUM,  in the last 72 hours  Studies/Results: No results found.  Assessment/Plan: Postop day #5: The patient is doing well. I answered all her questions. She will be discharged. Nothing has changed since her discharge summary done yesterday.  LOS: 5 days     JENKINS,JEFFREY D 10/20/2013, 4:43 PM

## 2013-10-20 NOTE — Progress Notes (Signed)
Read, reviewed, edited and agree with student's findings and recommendations.  Rebecca B. Medendorp, PT, DPT #[id number]  

## 2014-01-02 ENCOUNTER — Encounter: Payer: Self-pay | Admitting: *Deleted

## 2014-01-09 ENCOUNTER — Ambulatory Visit
Admission: RE | Admit: 2014-01-09 | Discharge: 2014-01-09 | Disposition: A | Discharge status: Home / Self Care | Payer: BC Managed Care – PPO | Source: Ambulatory Visit | Attending: Family Medicine | Admitting: Family Medicine

## 2014-01-09 ENCOUNTER — Encounter (INDEPENDENT_AMBULATORY_CARE_PROVIDER_SITE_OTHER): Payer: Self-pay

## 2014-01-09 DIAGNOSIS — Z1231 Encounter for screening mammogram for malignant neoplasm of breast: Secondary | ICD-10-CM

## 2014-05-04 ENCOUNTER — Ambulatory Visit (INDEPENDENT_AMBULATORY_CARE_PROVIDER_SITE_OTHER): Payer: BLUE CROSS/BLUE SHIELD | Admitting: Licensed Clinical Social Worker

## 2014-05-04 DIAGNOSIS — F330 Major depressive disorder, recurrent, mild: Secondary | ICD-10-CM

## 2014-05-11 ENCOUNTER — Ambulatory Visit (INDEPENDENT_AMBULATORY_CARE_PROVIDER_SITE_OTHER): Payer: BLUE CROSS/BLUE SHIELD | Admitting: Licensed Clinical Social Worker

## 2014-05-11 DIAGNOSIS — F330 Major depressive disorder, recurrent, mild: Secondary | ICD-10-CM

## 2014-05-18 ENCOUNTER — Ambulatory Visit: Payer: BLUE CROSS/BLUE SHIELD | Admitting: Licensed Clinical Social Worker

## 2014-05-18 DIAGNOSIS — I773 Arterial fibromuscular dysplasia: Secondary | ICD-10-CM | POA: Insufficient documentation

## 2014-05-25 ENCOUNTER — Ambulatory Visit (INDEPENDENT_AMBULATORY_CARE_PROVIDER_SITE_OTHER): Payer: BLUE CROSS/BLUE SHIELD | Admitting: Licensed Clinical Social Worker

## 2014-05-25 DIAGNOSIS — F330 Major depressive disorder, recurrent, mild: Secondary | ICD-10-CM

## 2014-06-05 ENCOUNTER — Ambulatory Visit: Payer: BLUE CROSS/BLUE SHIELD | Admitting: Licensed Clinical Social Worker

## 2014-12-29 ENCOUNTER — Other Ambulatory Visit: Payer: Self-pay

## 2014-12-29 DIAGNOSIS — Z1231 Encounter for screening mammogram for malignant neoplasm of breast: Secondary | ICD-10-CM

## 2015-01-13 ENCOUNTER — Ambulatory Visit: Payer: Self-pay

## 2015-03-10 ENCOUNTER — Ambulatory Visit
Admission: RE | Admit: 2015-03-10 | Discharge: 2015-03-10 | Disposition: A | Discharge status: Home / Self Care | Payer: BLUE CROSS/BLUE SHIELD | Source: Ambulatory Visit

## 2015-03-10 DIAGNOSIS — Z1231 Encounter for screening mammogram for malignant neoplasm of breast: Secondary | ICD-10-CM

## 2015-04-27 DIAGNOSIS — R413 Other amnesia: Secondary | ICD-10-CM | POA: Insufficient documentation

## 2015-05-05 ENCOUNTER — Other Ambulatory Visit: Payer: Self-pay | Admitting: *Deleted

## 2015-05-05 ENCOUNTER — Encounter: Payer: Self-pay | Admitting: Internal Medicine

## 2015-05-06 ENCOUNTER — Ambulatory Visit (INDEPENDENT_AMBULATORY_CARE_PROVIDER_SITE_OTHER): Payer: BLUE CROSS/BLUE SHIELD | Admitting: Internal Medicine

## 2015-05-06 ENCOUNTER — Encounter: Payer: Self-pay | Admitting: Internal Medicine

## 2015-05-06 VITALS — BP 110/78 | HR 69 | Temp 98.1°F | Resp 12 | Ht 62.25 in | Wt 155.6 lb

## 2015-05-06 DIAGNOSIS — E871 Hypo-osmolality and hyponatremia: Secondary | ICD-10-CM | POA: Diagnosis not present

## 2015-05-06 LAB — URINALYSIS
Bilirubin Urine: NEGATIVE
HGB URINE DIPSTICK: NEGATIVE
KETONES UR: NEGATIVE
Leukocytes, UA: NEGATIVE
Nitrite: NEGATIVE
TOTAL PROTEIN, URINE-UPE24: NEGATIVE
UROBILINOGEN UA: 0.2 (ref 0.0–1.0)
Urine Glucose: NEGATIVE
pH: 7 (ref 5.0–8.0)

## 2015-05-06 LAB — URIC ACID: URIC ACID, SERUM: 3.5 mg/dL (ref 2.4–7.0)

## 2015-05-06 NOTE — Patient Instructions (Signed)
Please stop at the lab.  Please come back for a follow-up appointment in 3 months.  Hyponatremia Hyponatremia is when the amount of salt (sodium) in your blood is too low. When sodium levels are low, your cells absorb extra water and they swell. The swelling happens throughout the body, but it mostly affects the brain. CAUSES This condition may be caused by:  Heart, kidney, or liver problems.  Thyroid problems.  Adrenal gland problems.  Metabolic conditions, such as syndrome of inappropriate antidiuretic hormone (SIADH).  Severe vomiting and diarrhea.  Certain medicines or illegal drugs.  Dehydration.  Drinking too much water.  Eating a diet that is low in sodium.  Large burns on your body.  Sweating. RISK FACTORS This condition is more likely to develop in people who:  Have long-term (chronic) kidney disease.  Have heart failure.  Have a medical condition that causes frequent or excessive diarrhea.  Have metabolic conditions, such as Addison disease or SIADH.  Take certain medicines that affect the sodium and fluid balance in the blood. Some of these medicine types include:  Diuretics.  NSAIDs.  Some opioid pain medicines.  Some antidepressants.  Some seizure prevention medicines. SYMPTOMS  Symptoms of this condition include:  Nausea and vomiting.  Confusion.  Lethargy.  Agitation.  Headache.  Seizures.  Unconsciousness.  Appetite loss.  Muscle weakness and cramping.  Feeling weak or light-headed.  Having a rapid heart rate.  Fainting, in severe cases. DIAGNOSIS This condition is diagnosed with a medical history and physical exam. You will also have other tests, including:  Blood tests.  Urine tests. TREATMENT Treatment for this condition depends on the cause. Treatment may include:  Fluids given through an IV tube that is inserted into one of your veins.  Medicines to correct the sodium imbalance. If medicines are causing the  condition, the medicines will need to be adjusted.  Limiting water or fluid intake to get the correct sodium balance. HOME CARE INSTRUCTIONS  Take medicines only as directed by your health care provider. Many medicines can make this condition worse. Talk with your health care provider about any medicines that you are currently taking.  Carefully follow a recommended diet as directed by your health care provider.  Carefully follow instructions from your health care provider about fluid restrictions.  Keep all follow-up visits as directed by your health care provider. This is important.  Do not drink alcohol. SEEK MEDICAL CARE IF:  You develop worsening nausea, fatigue, headache, confusion, or weakness.  Your symptoms go away and then return.  You have problems following the recommended diet. SEEK IMMEDIATE MEDICAL CARE IF:  You have a seizure.  You faint.  You have ongoing diarrhea or vomiting.   This information is not intended to replace advice given to you by your health care provider. Make sure you discuss any questions you have with your health care provider.   Document Released: 03/31/2002 Document Revised: 08/25/2014 Document Reviewed: 04/30/2014 Elsevier Interactive Patient Education [employer]2016 Elsevier Inc.

## 2015-05-06 NOTE — Progress Notes (Signed)
Patient ID: Julie Stone, female   DOB: 19-Nov-1965, 50 y.o.   MRN: [id number]   HPI  Julie Stone is a 50 y.o.-year-old female, referred by her PCP, Dr. Clelia Croft, for  Management of hyponatremia.  Pt has been dx with hyponatremia "years ago". Lowest level: 123 in 2015 per the records available to me. She tells me that her sister and her father also have hyponatremia.   I reviewed her sodium levels: 03/24/2015: Sodium 126 Lab Results  Component Value Date   NA 123* 10/16/2013   NA 133* 10/06/2013   NA 133* 07/08/2012   NA 130* 11/24/2010   NA 130* 09/24/2010   NA 140 01/26/2009   NA 134* 06/07/2008   NA 131* 06/06/2008   NA 128* 06/06/2008   Pt does not add much salt to her food and she drinks 5-6 glasses a day during the winter and 2.5 L fluids a day during the summer.  She tried to decrease her water intake to 1 glass 2x a day x 10 days + increased salt >> Na 133 after this.however, she developed swelling in her extremities and her face to try to increase salt and also could not limit herself to the recommended amount of water.  She has: - chronic migraines - memory loss and disorientation - was on Aricept - nausea - along with the HAs - on Phenergan  She has had recurring kidney infections for years as a child >> treated with ABx >> improved.   Per review of the chart, her hyponatremia is occasionally associated with hypochloremia but not alkalosis.  No swelling in legs or arms except when she tried to increase the salt.. No heart, kidney, liver dysfunction.   Pt does not smoke.   Pt also has no h/o OP. No falls, no fractures.   No HTG: last lipid level was on 03/24/2015: 213/65/69/131.  No hypothyroidism: latest TSH normal, at 1.5 on 08/14/2014.  No Br CA or other cancer history.  Has a previous brain MRI performed in 2013 the does not show any pituitary masses.  + GERD - in last month. Now 6-8 Tums a day.  She had a TAH + BSO at 50 y/o. She was briefly on  estrogen cream >> then stopped. No h/o HRT b/c of migraines with aura. No triptans, also. On Effexor, Zanaflex, Toradol, botox for migraines control.   On Wellbutrin for depression - stress at home: husband alcoholic, daughter with Lyme ds.  She exercises by lifting weights twice a week.  ROS: Constitutional: no weight gain/loss, + fatigue, + Hot flashes Eyes: no blurry vision, no xerophthalmia ENT: no sore throat, no nodules palpated in throat, no dysphagia/odynophagia, no hoarseness, + tinnitus, + decreased hearing Cardiovascular: no CP/SOB/palpitations/leg swelling Respiratory: no cough/SOB Gastrointestinal: no N/V/D/+ C/+ heartburn Musculoskeletal: no muscle/+ joint aches Skin: + easy bruising, itching, hair loss Neurological: no tremors/numbness/tingling/dizziness, + headache Psychiatric: no depression/anxiety + low libido  Past Medical History  Diagnosis Date  . Carotid artery occlusion   . 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 Depression   . GERD (gastroesophageal reflux disease)   . Migraines     4/week - does have botox injections  . Hx of blood clots     Pt had blood clot in right thumb  . Heart murmur    Past Surgical History  Procedure Laterality Date  . Abdominal hysterectomy    . Foot surgery Right   . Wrist surgery  Right   . Cesarean section     Social History   Social History  . Marital Status: Married    Spouse Name: N/A  . Number of Children: 2   Occupational History  . n/a   Social History Main Topics  . Smoking status: Never Smoker   . Smokeless tobacco: Never Used  . Alcohol Use: 1.2 oz/week    2 Glasses of wine per week  . Drug Use: No   Social History Narrative   Married   2 daughters   Exercise: weight lifting 2x weekly; some cardio      Current Outpatient Prescriptions on File Prior to Visit  Medication Sig Dispense Refill  . aspirin-acetaminophen-caffeine (EXCEDRIN MIGRAINE) 250-250-65 MG  per tablet Take 2 tablets by mouth every 4 (four) hours as needed for migraine.     Marland Kitchen buPROPion (WELLBUTRIN XL) 300 MG 24 hr tablet Take 300 mg by mouth daily.    . dabigatran (PRADAXA) 150 MG CAPS capsule Take 150 mg by mouth 2 (two) times daily.    . diphenhydrAMINE (BENADRYL) 25 MG tablet Take 25-50 mg by mouth at bedtime as needed for sleep.    Marland Kitchen donepezil (ARICEPT) 10 MG tablet Take 10 mg by mouth daily.    . fluticasone (FLONASE) 50 MCG/ACT nasal spray Place 2 sprays into both nostrils daily.    . Melatonin 5 MG TABS Take 5-10 mg by mouth at bedtime.    . montelukast (SINGULAIR) 10 MG tablet Take 10 mg by mouth at bedtime.    . Multiple Vitamin (MULTIVITAMIN WITH MINERALS) TABS tablet Take 1 tablet by mouth daily.    . OnabotulinumtoxinA (BOTOX IJ) Inject as directed. Every 3 months at Dr. Mickie Kay office, [place]    . PRESCRIPTION MEDICATION Apply 1 application topically every 3 (three) hours as needed (migraines). Promethazine topical compound ([facility])    . tizanidine (ZANAFLEX) 2 MG capsule Take 2 mg by mouth 3 (three) times daily.    Marland Kitchen venlafaxine XR (EFFEXOR-XR) 150 MG 24 hr capsule Take 300 mg by mouth daily.     No current facility-administered medications on file prior to visit.   Allergies  Allergen Reactions  . Chlorhexidine Gluconate Itching  . Darvon [Propoxyphene Hcl] Nausea And Vomiting and Other (See Comments)    Passed out  . Morphine And Related Itching  . Sulfa Antibiotics Swelling  . Triptans    Family History  Problem Relation Age of Onset  . Hypertension Mother   . Migraines Father   . Depression Father    PE: BP 114/66 mmHg  Pulse 72  Temp(Src) 98.1 F (36.7 C) (Oral)  Resp 12  Ht 5' 2.25" (1.581 m)  Wt 155 lb 9.6 oz (70.58 kg)  BMI 28.24 kg/m2  SpO2 98% Wt Readings from Last 3 Encounters:  05/06/15 155 lb 9.6 oz (70.58 kg)  10/18/13 139 lb 15.9 oz (63.5 kg)  10/06/13 146 lb 12.8 oz (66.588 kg)   Orthostatics were checked  >> normal:  Constitutional: overweight, in NAD Eyes: PERRLA, EOMI ENT: moist mucous membranes, no thyromegaly,no cervical lymphadenopathy Cardiovascular: RRR, No MRG Respiratory: CTA B Gastrointestinal: abdomen soft, NT, ND, BS+ Musculoskeletal: no deformities, strength intact in all 4 Skin: moist, warm, no rashes Neurological: no tremor with outstretched hands, DTR normal in all 4  ASSESSMENT: 1. Hyponatremia Patient with chronic (almost lifelong per her report) hyponatremia. Sodium has fluctuated between 126 and 133. - No indication of pseudohyponatremia (no increased triglycerides or proteins), dilutional  hyponatremia (hypervolemia, hyperglycemia).  - Pt has hypochloremia associated with her hyponatremia.  In this case, especially in the setting of a low urinary specific gravity, consideration is for: - water overload - diarrhea/vomiting - long-standing diuretic use However, pt has none of these. - I also reviewed her thyroid tests , LFTs , GFR , glucose values , and none are abnormal enough to cause hyponatremia -  The most interesting piece of information is that 2 of her family members have hyponatremia: Her sister and her father. This raises the question of familial hyponatremia with a possible (? Renal) genetic mutation contributing. - She has tried to limit her fluid intake and increase her salt intake in case she has SIADH , but these did not help a lot with her sodium level and were very difficult for her to follow. She noticed swelling in her extremities and in her face  With a high salt diet, and she would not want to start this again. -  She has a long history of migraine headaches and also had memory loss for which she was started on Aricept.  Although not definitive, these can all be signs/symptoms of hyponatremia.   - We discussed about long-term complication of hyponatremia including osteoporosis. She is at risk for this due to early  Surgical menopause. - We discussed  about possible treatments for  Hyponatremia, depending on her results. Discussed about  Demeclocycline, Vaptans, sometimes diuretics, or steroids if adrenally insufficient. -  Today I would like to check the following labs if test results point towards SIADH, we might need to test for adrenal insufficiency. I have a very low suspicion for this, though. Orders Placed This Encounter  Procedures  . Urinalysis  . Sodium, urine, random  . Potassium, Urine Random  . Osmolality  . Osmolality, urine  . Arginine vasopressin hormone  . BASIC METABOLIC PANEL WITH GFR  . Uric acid   - time spent with the patient: 1 hour, of which >50% was spent in obtaining information about her symptoms, reviewing her previous labs, evaluations, and treatments, counseling her about her condition (please see the discussed topics above), and developing a plan to further investigate it; she had a number of questions which I addressed.  Office Visit on 05/06/2015  Component Date Value Ref Range Status  . Color, Urine 05/06/2015 YELLOW  Yellow;Lt. Yellow Final  . APPearance 05/06/2015 CLEAR  Clear Final  . Specific Gravity, Urine 05/06/2015 <=1.005* 1.000 - 1.030 Final  . pH 05/06/2015 7.0  5.0 - 8.0 Final  . Total Protein, Urine 05/06/2015 NEGATIVE  Negative Final  . Urine Glucose 05/06/2015 NEGATIVE  Negative Final  . Ketones, ur 05/06/2015 NEGATIVE  Negative Final  . Bilirubin Urine 05/06/2015 NEGATIVE  Negative Final  . Hgb urine dipstick 05/06/2015 NEGATIVE  Negative Final  . Urobilinogen, UA 05/06/2015 0.2  0.0 - 1.0 Final  . Leukocytes, UA 05/06/2015 NEGATIVE  Negative Final  . Nitrite 05/06/2015 NEGATIVE  Negative Final  . Uric Acid, Serum 05/06/2015 3.5  2.4 - 7.0 mg/dL Final    Component     Latest Ref Rng 05/06/2015  Sodium     135 - 146 mmol/L 126 (L)  Potassium     3.5 - 5.3 mmol/L 4.7  Chloride     98 - 110 mmol/L 90 (L)  CO2     20 - 31 mmol/L 26  Glucose     65 - 99 mg/dL 80  BUN     7 -  25 mg/dL 13  Creatinine     2.13 - 1.05 mg/dL 0.86  Calcium     8.6 - 10.4 mg/dL 9.1  GFR, Est African American     >=60 mL/min >89  GFR, Est Non African American     >=60 mL/min 80  Sodium, Ur     28 - 272 mmol/L 17 (L)  Osmolality     275 - 300 mOsm/kg 262 (L)  Osmolality, Ur     390 - 1090 mOsm/kg 151 (L)  Arginine Vasopressin      <1.0 (L)  Uric Acid, Serum     2.4 - 7.0 mg/dL 3.5   Hyponatremia with hypochloremia.  Both her urine and plasma osmolality are low. AVP undetectable. Urinary sodium is also low , therefore, I doubt SIADH. She has an interesting  Family history of hyponatremia , so she needs avoidance of possible medications that can contribute to hyponatremia (Effexor and  Wellbutrin). I'm not sure if it's possible for her to have a trial without these 2 medications...   I wonder whether she may benefit from nephrology evaluation, especially with her family history of  Hyponatremia.Marland KitchenMarland Kitchen

## 2015-05-07 LAB — BASIC METABOLIC PANEL WITH GFR
BUN: 13 mg/dL (ref 7–25)
CALCIUM: 9.1 mg/dL (ref 8.6–10.4)
CO2: 26 mmol/L (ref 20–31)
Chloride: 90 mmol/L — ABNORMAL LOW (ref 98–110)
Creat: 0.85 mg/dL (ref 0.50–1.05)
GFR, EST NON AFRICAN AMERICAN: 80 mL/min (ref >=60)
GFR, Est African American: >89 mL/min (ref >=60)
GLUCOSE: 80 mg/dL (ref 65–99)
Potassium: 4.7 mmol/L (ref 3.5–5.3)
SODIUM: 126 mmol/L — AB (ref 135–146)

## 2015-05-07 LAB — OSMOLALITY, URINE: OSMOLALITY UR: 151 mosm/kg — AB (ref 390–1090)

## 2015-05-07 LAB — SODIUM, URINE, RANDOM: Sodium, Ur: 17 mmol/L — ABNORMAL LOW (ref 28–272)

## 2015-05-07 LAB — OSMOLALITY: OSMOLALITY: 262 mosm/kg — AB (ref 275–300)

## 2015-05-15 LAB — ARGININE VASOPRESSIN HORMONE: Arginine Vasopressin: <1 pg/mL — ABNORMAL LOW

## 2015-05-17 DIAGNOSIS — E871 Hypo-osmolality and hyponatremia: Secondary | ICD-10-CM | POA: Insufficient documentation

## 2015-05-19 ENCOUNTER — Encounter: Payer: Self-pay | Admitting: *Deleted

## 2015-05-24 ENCOUNTER — Encounter: Payer: Self-pay | Admitting: Surgery

## 2015-05-24 ENCOUNTER — Telehealth: Payer: Self-pay | Admitting: Endocrinology

## 2015-05-24 ENCOUNTER — Telehealth: Payer: Self-pay | Admitting: Internal Medicine

## 2015-05-24 DIAGNOSIS — G43109 Migraine with aura, not intractable, without status migrainosus: Secondary | ICD-10-CM | POA: Insufficient documentation

## 2015-05-24 DIAGNOSIS — Z1589 Genetic susceptibility to other disease: Secondary | ICD-10-CM

## 2015-05-24 DIAGNOSIS — D6859 Other primary thrombophilia: Secondary | ICD-10-CM | POA: Insufficient documentation

## 2015-05-24 DIAGNOSIS — E7212 Methylenetetrahydrofolate reductase deficiency: Secondary | ICD-10-CM | POA: Insufficient documentation

## 2015-05-24 HISTORY — DX: Genetic susceptibility to other disease: Z15.89

## 2015-05-24 NOTE — Telephone Encounter (Signed)
Patient stated that she is returning your call °

## 2015-05-24 NOTE — Telephone Encounter (Signed)
Patient is returning your call.  

## 2015-05-26 NOTE — Telephone Encounter (Signed)
Pt is declining the stopping of any of her meds.  She should not have been super hydrated per pt.  Please call the pt back asap and let her know what the next steps are.

## 2015-05-27 NOTE — Telephone Encounter (Signed)
Given the comment on her lab results and pt declining to stop taking her meds would you recommend a nephrology referral.  Let me know and I will contact pt.

## 2015-05-27 NOTE — Telephone Encounter (Signed)
Called and left her a message Re: further plan. She definitely should not stop Effexor and Wellbutrin all of a sudden, and I'm not even sure if these can even be stopped in the future. This is something that I discussed with her primary care doctor and they would be the one to orchestrate possible medication changes. I did advise her that she may benefit from a nephrology referral or maybe even a referral to an academic center like [facility] and [facility] (either nephrology or endocrinology) for further testing and a possibly more clear diagnosis.

## 2015-06-25 IMAGING — MG MM SCREEN MAMMOGRAM BILATERAL
4 series · 4 of 4 positions shown · non-contrast
Comparison: None.

CLINICAL DATA: Screening.

EXAM:
DIGITAL SCREENING BILATERAL MAMMOGRAM WITH CAD

[R CC]
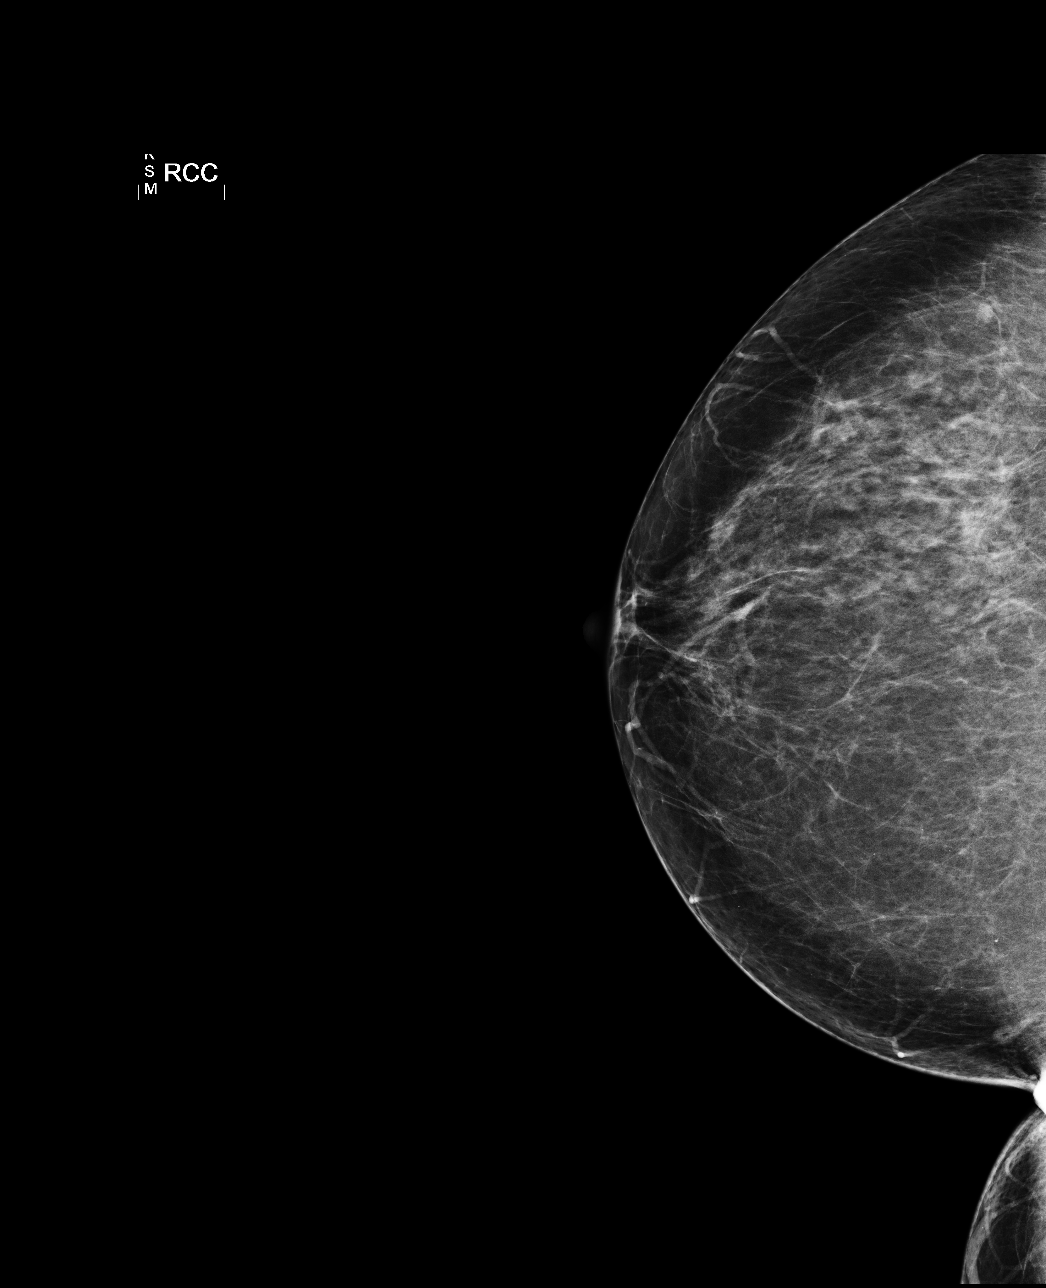

[L CC]
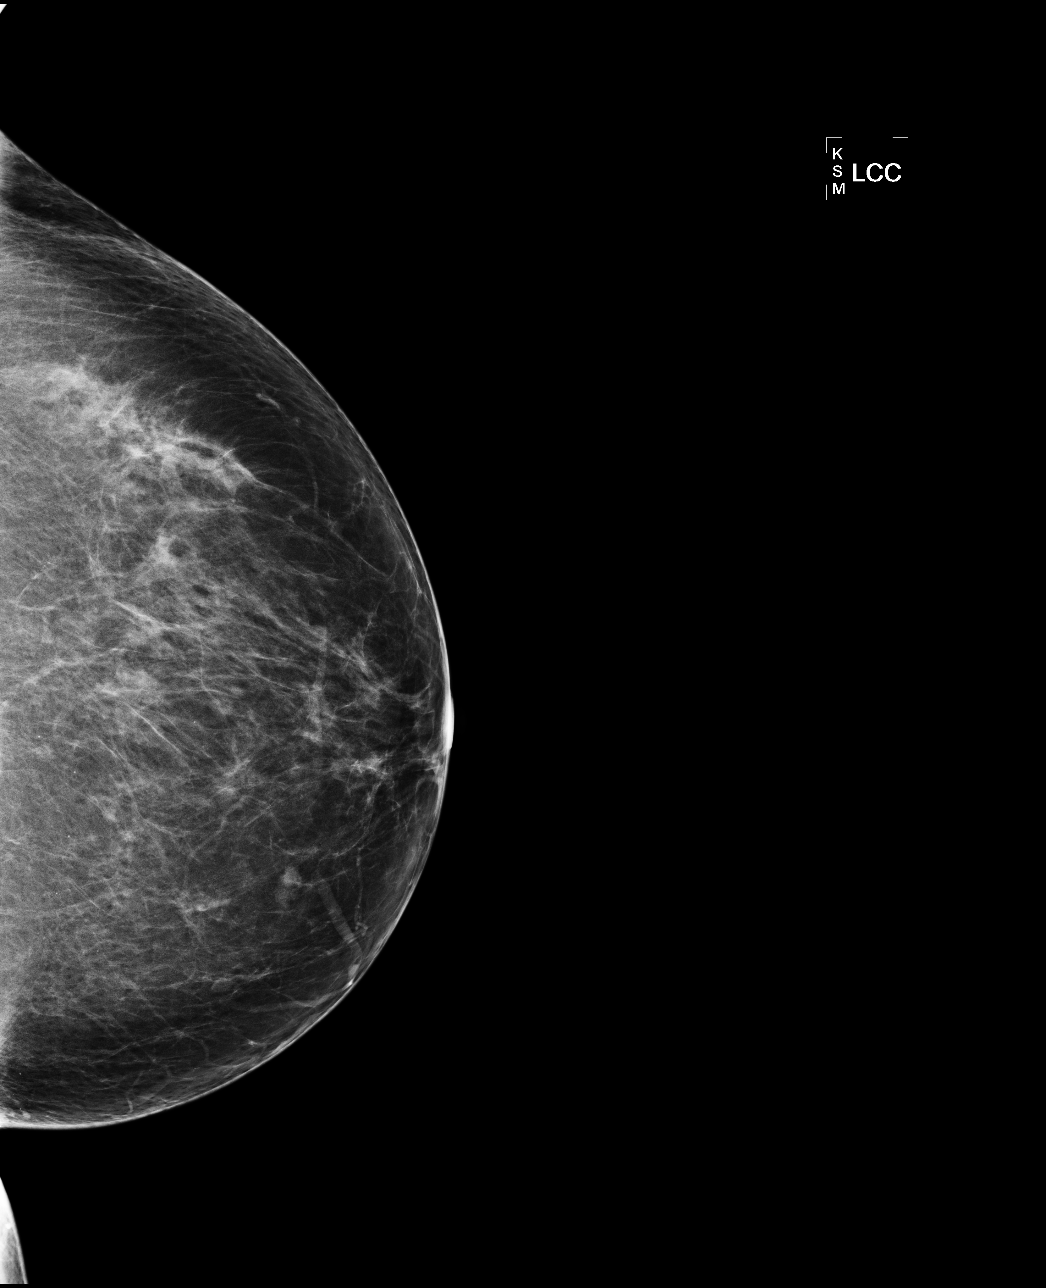

[L MLO]
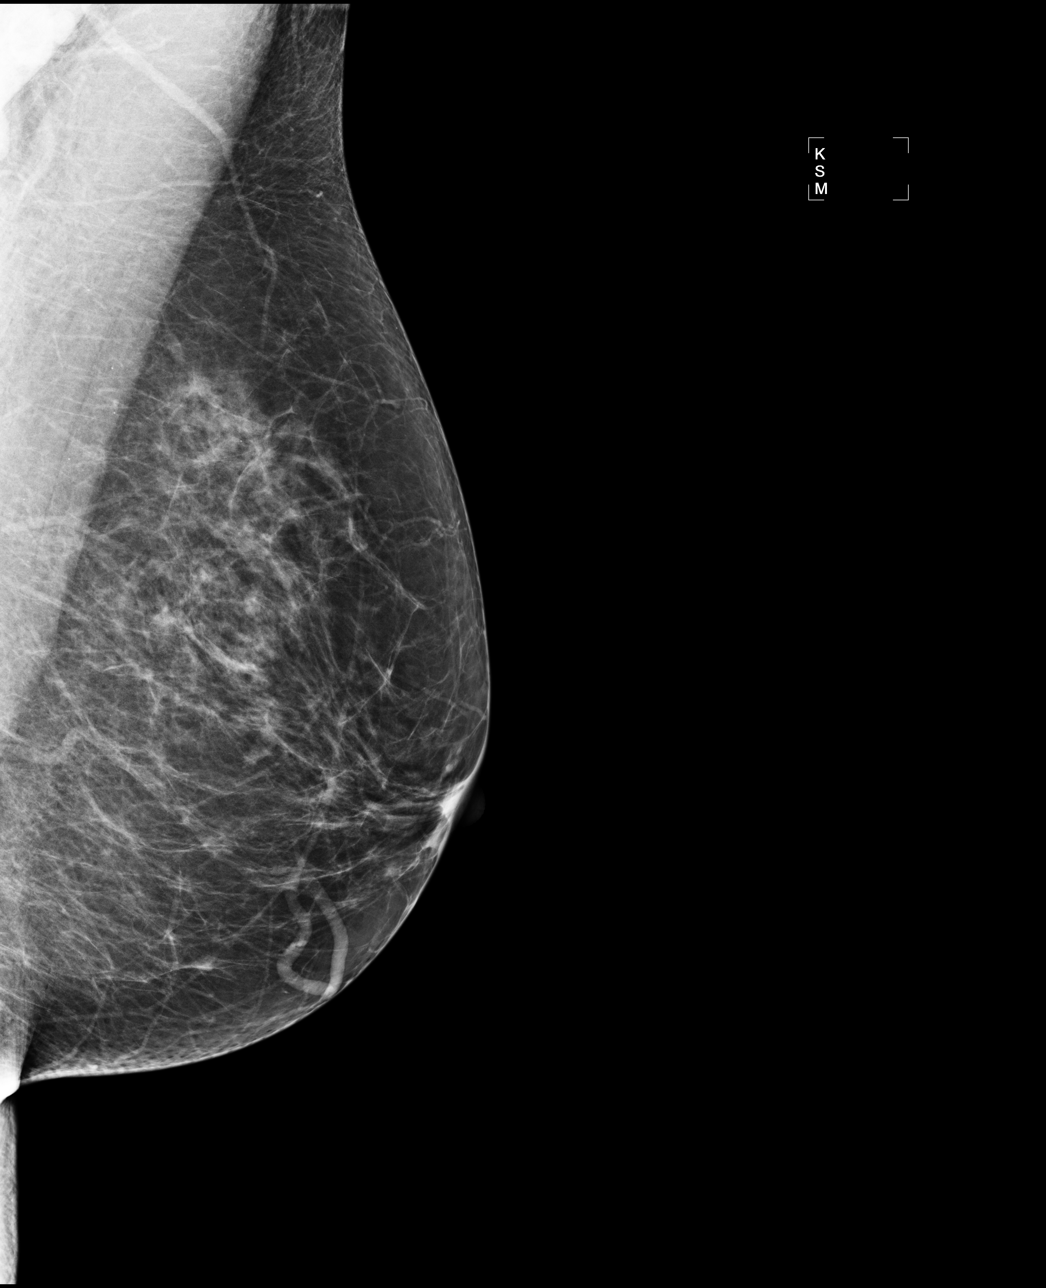

[R MLO]
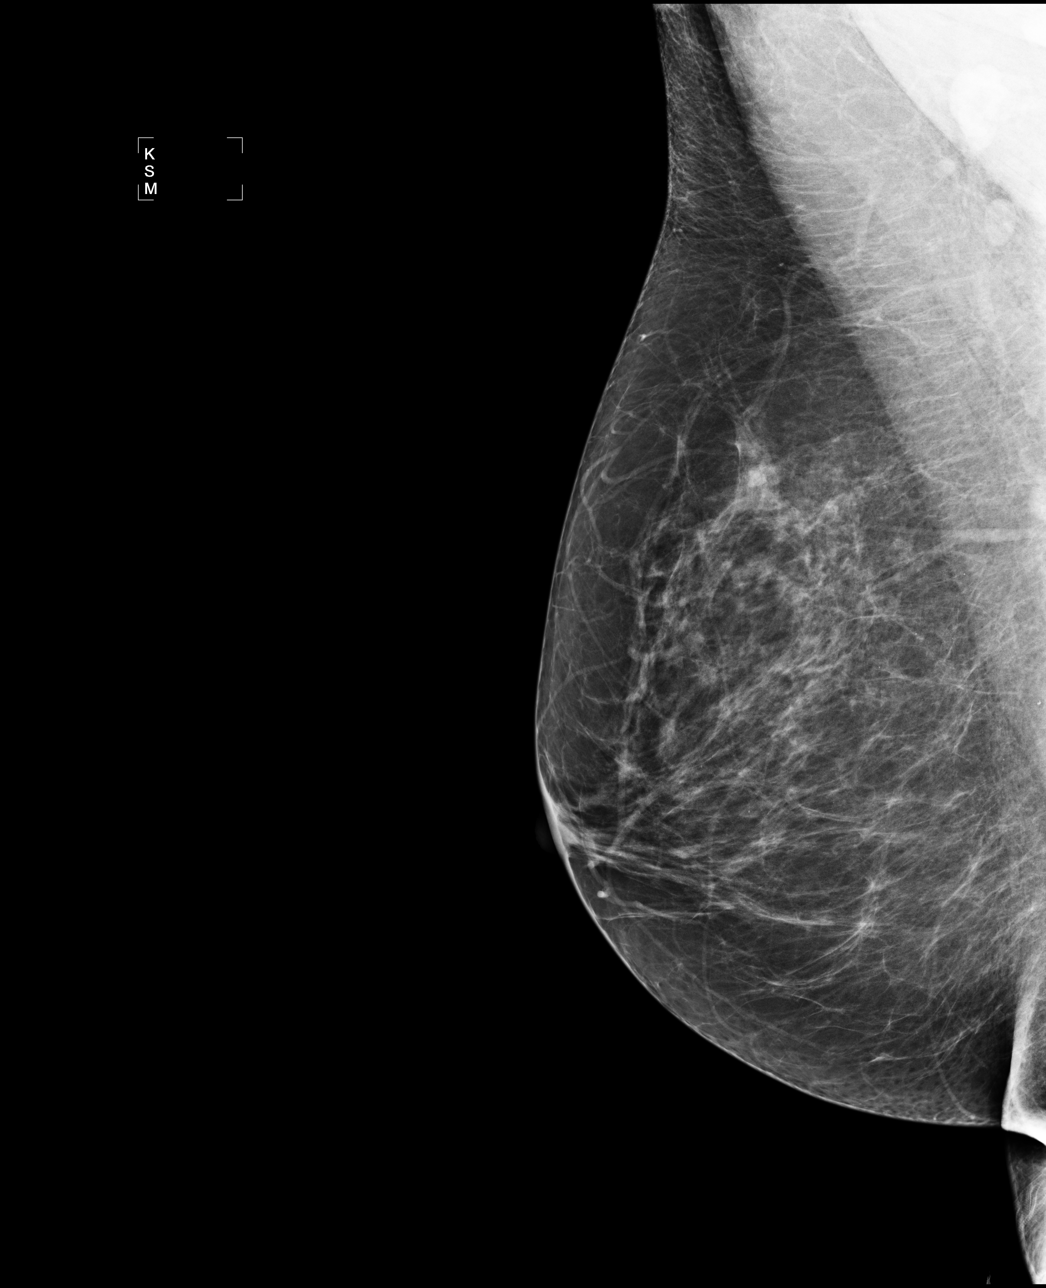

[4 of 4 positions shown; findings below may reference images not displayed]

ACR Breast Density Category b: There are scattered areas of
fibroglandular density.
FINDINGS: There are no findings suspicious for malignancy. Images were
processed with CAD.
IMPRESSION: No mammographic evidence of malignancy. A result letter of this
screening mammogram will be mailed directly to the patient.

RECOMMENDATION:
Screening mammogram in one year. (Code:[id number])

BI-RADS CATEGORY  1: Negative.

## 2015-08-04 ENCOUNTER — Ambulatory Visit: Payer: Self-pay | Admitting: Internal Medicine

## 2015-08-10 DIAGNOSIS — M5481 Occipital neuralgia: Secondary | ICD-10-CM | POA: Insufficient documentation

## 2016-05-04 ENCOUNTER — Other Ambulatory Visit: Payer: Self-pay | Admitting: Family Medicine

## 2016-05-04 DIAGNOSIS — Z1231 Encounter for screening mammogram for malignant neoplasm of breast: Secondary | ICD-10-CM

## 2016-05-24 ENCOUNTER — Ambulatory Visit: Payer: Self-pay

## 2016-05-30 ENCOUNTER — Ambulatory Visit: Payer: Self-pay

## 2016-06-02 ENCOUNTER — Ambulatory Visit: Payer: Self-pay

## 2016-06-02 ENCOUNTER — Ambulatory Visit
Admission: RE | Admit: 2016-06-02 | Discharge: 2016-06-02 | Disposition: A | Discharge status: Home / Self Care | Payer: Managed Care, Other (non HMO) | Source: Ambulatory Visit | Attending: Family Medicine | Admitting: Family Medicine

## 2016-06-02 DIAGNOSIS — Z1231 Encounter for screening mammogram for malignant neoplasm of breast: Secondary | ICD-10-CM

## 2016-07-17 DIAGNOSIS — G43709 Chronic migraine without aura, not intractable, without status migrainosus: Secondary | ICD-10-CM | POA: Insufficient documentation

## 2016-11-17 ENCOUNTER — Encounter: Payer: Self-pay | Admitting: Podiatry

## 2016-11-17 ENCOUNTER — Ambulatory Visit (INDEPENDENT_AMBULATORY_CARE_PROVIDER_SITE_OTHER): Payer: Managed Care, Other (non HMO) | Admitting: Podiatry

## 2016-11-17 ENCOUNTER — Ambulatory Visit (INDEPENDENT_AMBULATORY_CARE_PROVIDER_SITE_OTHER): Payer: Managed Care, Other (non HMO)

## 2016-11-17 VITALS — BP 109/64 | HR 54 | Resp 16

## 2016-11-17 DIAGNOSIS — M25571 Pain in right ankle and joints of right foot: Secondary | ICD-10-CM | POA: Diagnosis not present

## 2016-11-17 DIAGNOSIS — M2021 Hallux rigidus, right foot: Secondary | ICD-10-CM

## 2016-11-17 DIAGNOSIS — M79671 Pain in right foot: Secondary | ICD-10-CM

## 2016-11-17 NOTE — Progress Notes (Signed)
Subjective:    Patient ID: Julie Stone, female    DOB: 05/26/1965, 51 y.o.   MRN: [id number]  HPI Mrs. Julie Stone Presents the op city concerns of pain to the right big toe joint. She states that when she was 51 years old she did dislocate this toe which is in a cast for some time. She states that after that she did pretty well for several years. She did previously have surgery to "clean out the arthritis" on the right big toe joint but she states it actually made it worse after surgery. She s states that she gets occasional pain to the right big toe joint this is intermittent and not on a daily basis. She's had no recent treatment for it. She does state that she gardens quite a bit and she is pretty active. She denies any numbness or tingling. She states it occasionally swells but denies any swelling today. She has no other concerns.  She also states that she has had jaw surgery for it also being "bone on bone" and may need a replacement. No other joint pain aside from the foot.   Review of Systems  All other systems reviewed and are negative.      Objective:   Physical Exam General: AAO x3, NAD  Dermatological: Skin is warm, dry and supple bilateral. Nails x 10 are well manicured; remaining integument appears unremarkable at this time. There are no open sores, no preulcerative lesions, no rash or signs of infection present.  Vascular: Dorsalis Pedis artery and Posterior Tibial artery pedal pulses are 2/4 bilateral with immedate capillary fill time.  There is no pain with calf compression, swelling, warmth, erythema.   Neruologic: Grossly intact via light touch bilateral. Vibratory intact via tuning fork bilateral. Protective threshold with Semmes Wienstein monofilament intact to all pedal sites bilateral.   Musculoskeletal: On the right foot there is mild to palpation along the first MTPJ. There is decreased range of motion the MPJs is crepitation of the MPJ range of motion. There is no  specific area pinpoint bony tenderness or pain the vibratory sensation. There is minimal swelling to the area today there is no erythema or increase in warmth. Muscular strength 5/5 in all groups tested bilateral.  Gait: Unassisted, Nonantalgic.     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 Significant arthritic changes of present the first MTPJ. There is no evidence of acute fracture identified today. -We had a long discussion today in regards to both conservative and surgical treatment options. She states "I am a less is more person". I discussed with her orthotics with a Morton's extension to help take pressure the area. Will check insurance coverage for this. I also discussed the change in shoe gear and a stiffer soled shoe. She wears flexible she is when she is gardening but try change in the shoe and this will hopefully help. Also discussed steroid injection was has minimal pain today's we will hold off on this. If it swells or gets painful callus do this in the future if needed. Also given arthritis found to joints she states that she's never been checked for any underlying arthritic conditions. We'll do basic rheumatoid factor, ANA to check. Also we'll check basic metabolic panel to check her sodium levels she states that she do to get this done as a history of  low sodium. No symptoms associated with low sodium today.   Ovid CurdMatthew Stone, DPM

## 2016-11-20 ENCOUNTER — Other Ambulatory Visit: Payer: Self-pay | Admitting: Podiatry

## 2016-11-21 LAB — ANA: ANA Titer 1: NEGATIVE

## 2016-11-21 LAB — BASIC METABOLIC PANEL
BUN/Creatinine Ratio: 16 (ref 9–23)
BUN: 11 mg/dL (ref 6–24)
CHLORIDE: 94 mmol/L — AB (ref 96–106)
CO2: 20 mmol/L (ref 20–29)
Calcium: 9 mg/dL (ref 8.7–10.2)
Creatinine, Ser: 0.69 mg/dL (ref 0.57–1.00)
GFR calc Af Amer: 116 mL/min/{1.73_m2} (ref >59)
GFR calc non Af Amer: 100 mL/min/{1.73_m2} (ref >59)
Glucose: 91 mg/dL (ref 65–99)
POTASSIUM: 4.6 mmol/L (ref 3.5–5.2)
Sodium: 132 mmol/L — ABNORMAL LOW (ref 134–144)

## 2016-11-21 LAB — RHEUMATOID FACTOR: Rhuematoid fact SerPl-aCnc: <10 IU/mL (ref 0.0–13.9)

## 2016-11-22 ENCOUNTER — Telehealth: Payer: Self-pay | Admitting: *Deleted

## 2016-11-22 NOTE — Telephone Encounter (Addendum)
-----   Message from Vivi BarrackMatthew R Wagoner, DPM sent at 11/21/2016  5:45 PM EDT ----- Blood work was mostly normal. Please let her know that her sodium was only a little low at 132 (nomal is 134-144). It has been lower in the past. Follow-up with PCP.11/22/2016-No answer at home phone. I informed pt of Dr. Gabriel RungWagoner's review of labs and faxed to Dr. Lupita RaiderKimberlee Shaw. Faxed Labs to Dr. Clelia CroftShaw.

## 2016-12-13 ENCOUNTER — Telehealth: Payer: Self-pay | Admitting: Podiatry

## 2016-12-13 NOTE — Telephone Encounter (Signed)
Left message requarding benefits for orthotics and to call if any questions.

## 2017-06-05 ENCOUNTER — Other Ambulatory Visit (HOSPITAL_COMMUNITY): Payer: Self-pay | Admitting: Family Medicine

## 2017-06-05 ENCOUNTER — Ambulatory Visit (HOSPITAL_COMMUNITY)
Admission: RE | Admit: 2017-06-05 | Discharge: 2017-06-05 | Disposition: A | Discharge status: Home / Self Care | Payer: Managed Care, Other (non HMO) | Source: Ambulatory Visit | Attending: Vascular Surgery | Admitting: Vascular Surgery

## 2017-06-05 DIAGNOSIS — I6529 Occlusion and stenosis of unspecified carotid artery: Secondary | ICD-10-CM | POA: Diagnosis not present

## 2017-06-05 DIAGNOSIS — I6523 Occlusion and stenosis of bilateral carotid arteries: Secondary | ICD-10-CM | POA: Diagnosis not present

## 2017-06-06 ENCOUNTER — Other Ambulatory Visit: Payer: Self-pay | Admitting: Family Medicine

## 2017-06-06 DIAGNOSIS — Z1231 Encounter for screening mammogram for malignant neoplasm of breast: Secondary | ICD-10-CM

## 2017-06-06 LAB — VAS US CAROTID
LEFT ECA DIAS: -20 cm/s
LEFT VERTEBRAL DIAS: -18 cm/s
Left CCA dist dias: -19 cm/s
Left CCA dist sys: -66 cm/s
Left CCA prox dias: 24 cm/s
Left CCA prox sys: 114 cm/s
Left ICA dist dias: -27 cm/s
Left ICA dist sys: -70 cm/s
Left ICA prox dias: -20 cm/s
Left ICA prox sys: -48 cm/s
RIGHT CCA MID DIAS: -27 cm/s
RIGHT ECA DIAS: -19 cm/s
RIGHT VERTEBRAL DIAS: -16 cm/s
Right CCA prox dias: 38 cm/s
Right CCA prox sys: 155 cm/s
Right cca dist sys: -77 cm/s

## 2017-07-09 ENCOUNTER — Ambulatory Visit
Admission: RE | Admit: 2017-07-09 | Discharge: 2017-07-09 | Disposition: A | Discharge status: Home / Self Care | Payer: Managed Care, Other (non HMO) | Source: Ambulatory Visit | Attending: Family Medicine | Admitting: Family Medicine

## 2017-07-09 DIAGNOSIS — Z1231 Encounter for screening mammogram for malignant neoplasm of breast: Secondary | ICD-10-CM

## 2017-10-08 ENCOUNTER — Telehealth: Payer: Self-pay | Admitting: Podiatry

## 2017-10-08 NOTE — Telephone Encounter (Signed)
I'm a pt of Dr. Gabriel RungWagoner's and I would like my x-rays sent to my GP, Dr. Cam HaiKimberly Shaw so she will have a copy of those with any notes. My phone number is [phone] or [phone] if you have any questions. I would appreciate you sending those off. Thank you. Bye bye.

## 2017-10-10 ENCOUNTER — Telehealth: Payer: Self-pay | Admitting: Podiatry

## 2017-10-10 NOTE — Telephone Encounter (Signed)
I called the pt back in regards to the message she left requesting her x-rays be sent to Dr. Cam HaiKimberly Shaw, her general practitioner. I told the pt she would need to fill out and sign a medical records release form authorizing us to release those. I told the pt if she wanted to come by around 1:00 pm or after today that I would have the disc of x-rays up front and she could pick them up after filling out and signing a medical records release form.

## 2017-10-22 ENCOUNTER — Other Ambulatory Visit: Payer: Self-pay

## 2017-10-22 ENCOUNTER — Encounter (HOSPITAL_COMMUNITY): Payer: Self-pay | Admitting: *Deleted

## 2017-10-22 ENCOUNTER — Emergency Department (HOSPITAL_COMMUNITY): Payer: Managed Care, Other (non HMO)

## 2017-10-22 ENCOUNTER — Emergency Department (HOSPITAL_COMMUNITY)
Admission: EM | Admit: 2017-10-22 | Discharge: 2017-10-22 | Disposition: A | Discharge status: Home / Self Care | Payer: Managed Care, Other (non HMO) | Attending: Emergency Medicine | Admitting: Emergency Medicine

## 2017-10-22 DIAGNOSIS — G43909 Migraine, unspecified, not intractable, without status migrainosus: Secondary | ICD-10-CM | POA: Diagnosis not present

## 2017-10-22 DIAGNOSIS — R51 Headache: Secondary | ICD-10-CM | POA: Diagnosis present

## 2017-10-22 DIAGNOSIS — Z79899 Other long term (current) drug therapy: Secondary | ICD-10-CM | POA: Diagnosis not present

## 2017-10-22 LAB — CBC WITH DIFFERENTIAL/PLATELET
Abs Immature Granulocytes: 0 10*3/uL (ref 0.0–0.1)
Basophils Absolute: 0.1 10*3/uL (ref 0.0–0.1)
Basophils Relative: 1 %
Eosinophils Absolute: 0.2 10*3/uL (ref 0.0–0.7)
Eosinophils Relative: 3 %
HCT: 36.7 % (ref 36.0–46.0)
Hemoglobin: 11.9 g/dL — ABNORMAL LOW (ref 12.0–15.0)
Immature Granulocytes: 0 %
Lymphocytes Relative: 24 %
Lymphs Abs: 1.7 10*3/uL (ref 0.7–4.0)
MCH: 31.9 pg (ref 26.0–34.0)
MCHC: 32.4 g/dL (ref 30.0–36.0)
MCV: 98.4 fL (ref 78.0–100.0)
Monocytes Absolute: 0.6 10*3/uL (ref 0.1–1.0)
Monocytes Relative: 9 %
Neutro Abs: 4.6 10*3/uL (ref 1.7–7.7)
Neutrophils Relative %: 63 %
Platelets: 322 10*3/uL (ref 150–400)
RBC: 3.73 MIL/uL — ABNORMAL LOW (ref 3.87–5.11)
RDW: 12.9 % (ref 11.5–15.5)
WBC: 7.2 10*3/uL (ref 4.0–10.5)

## 2017-10-22 LAB — BASIC METABOLIC PANEL
Anion gap: 11 (ref 5–15)
BUN: 8 mg/dL (ref 6–20)
CO2: 28 mmol/L (ref 22–32)
Calcium: 9.5 mg/dL (ref 8.9–10.3)
Chloride: 90 mmol/L — ABNORMAL LOW (ref 98–111)
Creatinine, Ser: 0.81 mg/dL (ref 0.44–1.00)
GFR calc Af Amer: >60 mL/min (ref >60)
GFR calc non Af Amer: >60 mL/min (ref >60)
Glucose, Bld: 92 mg/dL (ref 70–99)
Potassium: 4.3 mmol/L (ref 3.5–5.1)
Sodium: 129 mmol/L — ABNORMAL LOW (ref 135–145)

## 2017-10-22 LAB — PROTIME-INR
INR: 1.14
Prothrombin Time: 14.5 seconds (ref 11.4–15.2)

## 2017-10-22 MED ORDER — METOCLOPRAMIDE HCL 10 MG PO TABS
5.0000 mg | ORAL_TABLET | Freq: Once | ORAL | Status: AC
  Administered 2017-10-22: 5 mg via ORAL
  Filled 2017-10-22: qty 1

## 2017-10-22 MED ORDER — DIPHENHYDRAMINE HCL 25 MG PO CAPS
25.0000 mg | ORAL_CAPSULE | Freq: Once | ORAL | Status: AC
  Administered 2017-10-22: 25 mg via ORAL
  Filled 2017-10-22: qty 1

## 2017-10-22 MED ORDER — SODIUM CHLORIDE 0.9 % IV BOLUS
1000.0000 mL | Freq: Once | INTRAVENOUS | Status: AC
  Administered 2017-10-22: 1000 mL via INTRAVENOUS

## 2017-10-22 MED ORDER — KETOROLAC TROMETHAMINE 30 MG/ML IJ SOLN: 30.0000 mg | Freq: Once | INTRAMUSCULAR | Status: DC

## 2017-10-22 MED ORDER — KETOROLAC TROMETHAMINE 30 MG/ML IJ SOLN
30.0000 mg | Freq: Once | INTRAMUSCULAR | Status: AC
  Administered 2017-10-22: 30 mg via INTRAVENOUS
  Filled 2017-10-22: qty 1

## 2017-10-22 NOTE — ED Provider Notes (Signed)
[facility] EMERGENCY DEPARTMENT Provider Note   CSN: [id number] Arrival date & time: 10/22/17  1651     History   Chief Complaint Chief Complaint  Patient presents with  . Headache    HPI Julie Stone is a 52 y.o. female.  HPI   52 year old female presents today with complaints of headache. Patient has a significant past medical history of migraines. Patient notes over the last 5 days she's had waxing and waning left-sided head pain, she notes intermittent nausea or vomiting, lightheadedness and dizziness. She also notes tingling sensation to her face and lips that has been coming and going as well. Patient notes that her husband thinks she has difficulty with finding her words, patient does not feel that she is having significant difficulty. Patient also notes that she occasionally has tingling in her bilateral lower legs. Patient reports taking home medications with intermittent relief of her headache, notes that generally her migraines resolve with at home management. Patient notes she has had decreased appetite recently and has had poor by mouth intake.she denies any loss of distal sensation strength, or motor function.no fevers.  Past Medical History:  Diagnosis Date  . Carotid artery occlusion   . Depression   . 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 GERD (gastroesophageal reflux disease)   . Heart murmur   . 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 Hx of blood clots    Pt had blood clot in right thumb  . Migraines    4/week - does have botox injections    Patient Active Problem List   Diagnosis Date Noted  . Chronic migraine without aura without status migrainosus, not intractable 07/17/2016  . Occipital neuralgia 08/10/2015  . 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 Migraine with aura 05/24/2015   . Hypercoagulable state  05/24/2015  . Hyponatremia 05/17/2015  . Memory disturbance 04/27/2015  . 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 Spondylolisthesis of lumbar region 10/15/2013  . Low back pain 08/28/2013  . Spondylolysis of lumbosacral region 08/28/2013  . History of blood clots 08/19/2012  . Occlusion and stenosis of carotid artery without mention of cerebral infarction 07/22/2012    Past Surgical History:  Procedure Laterality Date  . ABDOMINAL HYSTERECTOMY    . CESAREAN SECTION    . FOOT SURGERY Right   . WRIST SURGERY Right      OB History   None      Home Medications    Prior to Admission medications   Medication Sig Start Date End Date Taking? Authorizing Provider  aspirin-acetaminophen-caffeine (EXCEDRIN MIGRAINE) [phone] MG per tablet Take 2 tablets by mouth every 4 (four) hours as needed for migraine.     [provider]  buPROPion (WELLBUTRIN XL) 150 MG 24 hr tablet TAKE 1 TABLET EVERY MORNING WITH 300 MG TO EQUEL 450 MG 11/08/16   [provider]  clonazePAM (KLONOPIN) 0.5 MG tablet  11/10/16   [provider]  dabigatran (PRADAXA) 150 MG CAPS capsule Take 150 mg by mouth 2 (two) times daily.    [provider]  diphenhydrAMINE (BENADRYL) 25 MG tablet Take 25-50 mg by mouth at bedtime as needed for sleep.    [provider]  donepezil (ARICEPT) 10 MG tablet Take 10 mg by mouth daily.    [provider]  fluticasone (FLONASE) 50 MCG/ACT nasal spray  Place 2 sprays into both nostrils daily.    [provider]  ketoconazole (NIZORAL) 2 % shampoo  09/20/16   [provider]  ketorolac (TORADOL) 10 MG tablet  11/15/16   [provider]  Melatonin 5 MG TABS Take 5-10 mg by mouth at bedtime.    [provider]  montelukast (SINGULAIR) 10 MG tablet Take 10 mg by mouth at bedtime.    [provider]  Multiple Vitamin (MULTIVITAMIN WITH  MINERALS) TABS tablet Take 1 tablet by mouth daily.    [provider]  naratriptan (AMERGE) 2.5 MG tablet TAKE 1 TABLET AS NEDEED FOR MIGRAINE, CAN REPEAT IN 2 HOURS IF NEEDED 11/15/16   [provider]  OnabotulinumtoxinA (BOTOX IJ) Inject as directed. Every 3 months at Dr. Mickie Kay office, [facility]    [provider]  predniSONE (DELTASONE) 10 MG tablet  09/07/16   [provider]  PRESCRIPTION MEDICATION Apply 1 application topically every 3 (three) hours as needed (migraines). Promethazine topical compound ([facility])    [provider]  propranolol ER (INDERAL LA) 120 MG 24 hr capsule TAKE 1 CAPSULE (120 MG TOTAL) BY MOUTH DAILY. FOR MIGRAINE CONTROL 08/28/16   [provider]  tiZANidine (ZANAFLEX) 4 MG tablet TAKE 1 TABLET (4 MG TOTAL) BY MOUTH 4 TIMES DAILY. AS DIRECTED 11/15/16   [provider]  venlafaxine XR (EFFEXOR-XR) 150 MG 24 hr capsule Take 300 mg by mouth daily.    [provider]    Family History Family History  Problem Relation Age of Onset  . Hypertension Mother   . Migraines Father   . Depression Father   . Breast cancer Other 20    Social History Social History   Tobacco Use  . Smoking status: Never Smoker  . Smokeless tobacco: Never Used  Substance Use Topics  . Alcohol use: Yes    Alcohol/week: 1.2 oz    Types: 2 Glasses of wine per week  . Drug use: No     Allergies   Morphine; Sulfa antibiotics; Sulfamethoxazole; Chlorhexidine gluconate; Darvon [propoxyphene hcl]; Morphine and related; Triptans; and Propoxyphene   Review of Systems Review of Systems  All other systems reviewed and are negative.    Physical Exam Updated Vital Signs BP (!) 142/71   Pulse 72   Temp 98.3 F (36.8 C) (Oral)   Resp 15   Ht 5' 2.5" (1.588 m)   Wt 70.3 kg (155 lb)   SpO2 100%   BMI 27.90 kg/m   Physical Exam  Constitutional: She is oriented to person, place, and time.  She appears well-developed and well-nourished.  HENT:  Head: Normocephalic and atraumatic.  Eyes: Pupils are equal, round, and reactive to light. Conjunctivae are normal. Right eye exhibits no discharge. Left eye exhibits no discharge. No scleral icterus.  Neck: Normal range of motion. No JVD present. No tracheal deviation present.  Cardiovascular: Normal rate.  Soft systolic murmur- no carotid bruits  Pulmonary/Chest: Effort normal. No stridor. No respiratory distress. She has no wheezes.  Neurological: She is alert and oriented to person, place, and time. Coordination and gait normal. GCS eye subscore is 4. GCS verbal subscore is 5. GCS motor subscore is 6.  Psychiatric: She has a normal mood and affect. Her behavior is normal. Judgment and thought content normal.  Nursing note and vitals reviewed.    ED Treatments / Results  Labs (all labs ordered are listed, but only abnormal results are displayed) Labs Reviewed  BASIC METABOLIC PANEL - Abnormal; Notable for the following components:      Result Value   Sodium 129 (*)    Chloride 90 (*)    All other components within normal limits  CBC WITH DIFFERENTIAL/PLATELET - Abnormal; Notable for the following components:   RBC 3.73 (*)    Hemoglobin 11.9 (*)    All other components within normal limits  PROTIME-INR    EKG None  Radiology Ct Head Wo Contrast  Result Date: 10/22/2017 CLINICAL DATA:  52 year old female with headache, nausea and decreased energy since mowing the lawn last week. EXAM: CT HEAD WITHOUT CONTRAST TECHNIQUE: Contiguous axial images were obtained from the base of the skull through the vertex without intravenous contrast. COMPARISON:  CTA head and neck 05/09/2013. FINDINGS: Brain: Stable cerebral volume. No midline shift, ventriculomegaly, mass effect, evidence of mass lesion, intracranial hemorrhage or evidence of cortically based acute infarction. Gray-white matter differentiation is within normal limits  throughout the brain. Vascular: Mild Calcified atherosclerosis at the skull base. No suspicious intracranial vascular hyperdensity. Skull: Stable and negative. Sinuses/Orbits: Visualized paranasal sinuses and mastoids are stable and well pneumatized. Other: Stable and negative orbit and scalp soft tissues. IMPRESSION: Stable and normal noncontrast CT appearance of the brain. Electronically Signed   By: Odessa FlemingH  Hall M.D.   On: 10/22/2017 19:26    Procedures Procedures (including critical care time)  Medications Ordered in ED Medications  metoCLOPramide (REGLAN) tablet 5 mg (5 mg Oral Given 10/22/17 2041)  diphenhydrAMINE (BENADRYL) capsule 25 mg (25 mg Oral Given 10/22/17 2041)  sodium chloride 0.9 % bolus 1,000 mL (0 mLs Intravenous Stopped 10/22/17 2148)  ketorolac (TORADOL) 30 MG/ML injection 30 mg (30 mg Intravenous Given 10/22/17 2042)     Initial Impression / Assessment and Plan / ED Course  I have reviewed the triage vital signs and the nursing notes.  Pertinent labs & imaging results that were available during my care of the patient were reviewed by me and considered in my medical decision making (see chart for details).     52 year old female presents today with migraine. Patient had improvement in symptoms with the above medications and normal saline. She has no acute neurological deficits on my exam, CT head reassuring. She was slightly hyponatremic at 129, this is close to her baseline and she has chronically low sodium and is currently on sodium replacement. I have very low suspicion for acute intracranial abnormality, patient will be discharged with outpatient follow-up with neurology, stricter cautions given. She verbalized understanding and agreement to today's plan had no further questions or concerns at this time discharge.  Final Clinical Impressions(s) / ED Diagnoses   Final diagnoses:  Migraine without status migrainosus, not intractable, unspecified migraine type    ED Discharge  Orders    None       Eyvonne MechanicHedges, Raymonda Pell, PA-C 10/23/17 1347    Vanetta MuldersZackowski, Scott, MD 10/25/17 [phone]

## 2017-10-22 NOTE — ED Notes (Signed)
Pt verbalized understanding discharge instructions and denies any further needs or questions at this time. VS stable, ambulatory and steady gait.   

## 2017-10-22 NOTE — ED Notes (Signed)
Patient transported to CT 

## 2017-10-22 NOTE — Discharge Instructions (Addendum)
Please read attached information. If you experience any new or worsening signs or symptoms please return to the emergency room for evaluation. Please follow-up with your primary care provider or specialist as discussed. Please use previosly prescribed medication as needed.

## 2017-10-22 NOTE — ED Triage Notes (Signed)
Pt states last Wed she was mowing lawn and felt as if she had head exhaustion.  Since then she has constant headache, "foggy feeling", "thermostat's off", decreased energy and nausea.

## 2017-10-22 NOTE — ED Provider Notes (Signed)
Patient placed in Quick Look pathway, seen and evaluated   Chief Complaint: headache  HPI:   Patient with complicated medical history presents for evaluation of gradual onset, waxing and waning left-sided headache for 5 days. Associated symptoms include intermittent nausea, vomiting, lightheadedness, dizziness. Feels "pins and needles "to the face. Blurry vision yesterday, resolved today. Feels as though it is difficult to find her words sometimes. States this does not feel like her usual migraine headaches. Denies fever  ROS: positive for headache, vision changes, lightheadedness, dizziness Negative for fever, syncope  Physical Exam:   Gen: No distress  Psych: normal affect  Skin: Warm    Focused Exam: cranial nerves II through XII tested and intact. Fluent speech with no evidence of dysarthria or aphasia, no facial droop. Ambulates with a slight limp favoring the right side. States this occurs sometimes with her migraines. No pronator drift. Romberg sign technically absent but she does have significant swaying. No nystagmus. 5/5 strength of BUE and BLE major muscle groups.   Initiation of care has begun. The patient has been counseled on the process, plan, and necessity for staying for the completion/evaluation, and the remainder of the medical screening examination    Bennye AlmFawze, Karem Tomaso A, PA-C 10/22/17 1823    Mesner, Barbara CowerJason, MD 10/22/17 2142

## 2017-11-11 ENCOUNTER — Emergency Department (HOSPITAL_COMMUNITY): Payer: Managed Care, Other (non HMO)

## 2017-11-11 ENCOUNTER — Encounter (HOSPITAL_COMMUNITY): Payer: Self-pay

## 2017-11-11 ENCOUNTER — Emergency Department (HOSPITAL_COMMUNITY)
Admission: EM | Admit: 2017-11-11 | Discharge: 2017-11-11 | Disposition: A | Discharge status: Home / Self Care | Payer: Managed Care, Other (non HMO) | Attending: Emergency Medicine | Admitting: Emergency Medicine

## 2017-11-11 DIAGNOSIS — Z79899 Other long term (current) drug therapy: Secondary | ICD-10-CM | POA: Insufficient documentation

## 2017-11-11 DIAGNOSIS — R51 Headache: Secondary | ICD-10-CM | POA: Insufficient documentation

## 2017-11-11 DIAGNOSIS — Z7982 Long term (current) use of aspirin: Secondary | ICD-10-CM | POA: Insufficient documentation

## 2017-11-11 DIAGNOSIS — W19XXXA Unspecified fall, initial encounter: Secondary | ICD-10-CM

## 2017-11-11 NOTE — ED Provider Notes (Signed)
[facility] EMERGENCY DEPARTMENT Provider Note   CSN: [id number] Arrival date & time: 11/11/17  1315     History   Chief Complaint Chief Complaint  Patient presents with  . Fall    HPI Julie Stone is a 52 y.o. female.  HPI  Julie Stone is a 52yo female with a history of hypercoagulable state on pradaxa and migraines who presents to the emergency department for evaluation after hitting her head.  Patient reports that she was having a bad nightmare and hit the right side of her head against the nightstand today around 8 AM.  She states that she felt more foggy and was slower to answer questions while talking to her husband earlier today.  Also felt like it was more difficult to get her balance upon standing today.  She also has had a mild right-sided headache near where she hit her head which she reports feels similar to prior headaches.  Denies worst headache of her life.  She denies midline neck pain, back pain, open wound, vision loss or visual disturbance, numbness, weakness, nausea/vomiting, chest pain, shortness of breath, abdominal pain, lightheadedness, syncope.  Past Medical History:  Diagnosis Date  . Carotid artery occlusion   . Depression   . 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 GERD (gastroesophageal reflux disease)   . Heart murmur   . 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 Hx of blood clots    Pt had blood clot in right thumb  . Migraines    4/week - does have botox injections    Patient Active Problem List   Diagnosis Date Noted  . Chronic migraine without aura without status migrainosus, not intractable 07/17/2016  . Occipital neuralgia 08/10/2015  . 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 Migraine with aura 05/24/2015  . Hypercoagulable state  05/24/2015  . Hyponatremia  05/17/2015  . Memory disturbance 04/27/2015  . 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 Spondylolisthesis of lumbar region 10/15/2013  . Low back pain 08/28/2013  . Spondylolysis of lumbosacral region 08/28/2013  . History of blood clots 08/19/2012  . Occlusion and stenosis of carotid artery without mention of cerebral infarction 07/22/2012    Past Surgical History:  Procedure Laterality Date  . ABDOMINAL HYSTERECTOMY    . CESAREAN SECTION    . FOOT SURGERY Right   . WRIST SURGERY Right      OB History   None      Home Medications    Prior to Admission medications   Medication Sig Start Date End Date Taking? Authorizing Provider  aspirin-acetaminophen-caffeine (EXCEDRIN MIGRAINE) [phone] MG per tablet Take 2 tablets by mouth every 4 (four) hours as needed for migraine.     [provider]  buPROPion (WELLBUTRIN XL) 150 MG 24 hr tablet TAKE 1 TABLET EVERY MORNING WITH 300 MG TO EQUEL 450 MG 11/08/16   [provider]  clonazePAM (KLONOPIN) 0.5 MG tablet  11/10/16   [provider]  dabigatran (PRADAXA) 150 MG CAPS capsule Take 150 mg by mouth 2 (two) times daily.    [provider]  diphenhydrAMINE (BENADRYL) 25 MG tablet Take 25-50 mg by mouth at bedtime as needed for sleep.    [provider]  donepezil (ARICEPT) 10 MG tablet Take 10 mg by mouth daily.    [provider]  fluticasone (FLONASE) 50  MCG/ACT nasal spray Place 2 sprays into both nostrils daily.    [provider]  ketoconazole (NIZORAL) 2 % shampoo  09/20/16   [provider]  ketorolac (TORADOL) 10 MG tablet  11/15/16   [provider]  Melatonin 5 MG TABS Take 5-10 mg by mouth at bedtime.    [provider]  montelukast (SINGULAIR) 10 MG tablet Take 10 mg by mouth at bedtime.    [provider]  Multiple Vitamin (MULTIVITAMIN WITH MINERALS) TABS tablet Take 1 tablet by mouth daily.     [provider]  naratriptan (AMERGE) 2.5 MG tablet TAKE 1 TABLET AS NEDEED FOR MIGRAINE, CAN REPEAT IN 2 HOURS IF NEEDED 11/15/16   [provider]  OnabotulinumtoxinA (BOTOX IJ) Inject as directed. Every 3 months at Dr. Mickie Kay office, [facility]    [provider]  predniSONE (DELTASONE) 10 MG tablet  09/07/16   [provider]  PRESCRIPTION MEDICATION Apply 1 application topically every 3 (three) hours as needed (migraines). Promethazine topical compound ([facility])    [provider]  propranolol ER (INDERAL LA) 120 MG 24 hr capsule TAKE 1 CAPSULE (120 MG TOTAL) BY MOUTH DAILY. FOR MIGRAINE CONTROL 08/28/16   [provider]  tiZANidine (ZANAFLEX) 4 MG tablet TAKE 1 TABLET (4 MG TOTAL) BY MOUTH 4 TIMES DAILY. AS DIRECTED 11/15/16   [provider]  venlafaxine XR (EFFEXOR-XR) 150 MG 24 hr capsule Take 300 mg by mouth daily.    [provider]    Family History Family History  Problem Relation Age of Onset  . Hypertension Mother   . Migraines Father   . Depression Father   . Breast cancer Other 20    Social History Social History   Tobacco Use  . Smoking status: Never Smoker  . Smokeless tobacco: Never Used  Substance Use Topics  . Alcohol use: Yes    Alcohol/week: 1.2 oz    Types: 2 Glasses of wine per week  . Drug use: No     Allergies   Morphine; Sulfa antibiotics; Sulfamethoxazole; Chlorhexidine gluconate; Darvon [propoxyphene hcl]; Morphine and related; Triptans; and Propoxyphene   Review of Systems Review of Systems  Constitutional: Negative for chills and fever.  Eyes: Negative for visual disturbance.  Respiratory: Negative for shortness of breath.   Cardiovascular: Negative for chest pain.  Gastrointestinal: Negative for abdominal pain, nausea and vomiting.  Genitourinary: Negative for difficulty urinating.  Musculoskeletal: Negative for back pain and neck pain.  Skin:  Negative for wound.  Neurological: Positive for headaches. Negative for dizziness, weakness, light-headedness and numbness.  Psychiatric/Behavioral: Negative for agitation.     Physical Exam Updated Vital Signs BP 137/74 (BP Location: Left Arm)   Pulse (!) 54   Temp 98.4 F (36.9 C) (Oral)   Resp 16   Ht 5\' 2"  (1.575 m)   Wt 67.1 kg (148 lb)   SpO2 100%   BMI 27.07 kg/m   Physical Exam  Constitutional: She is oriented to person, place, and time. She appears well-developed and well-nourished. No distress.  Sitting at bedside in no apparent distress, nontoxic-appearing.  HENT:  Head: Normocephalic and atraumatic.  Mouth/Throat: Oropharynx is clear and moist. No oropharyngeal exudate.  No raccoon eyes or battle sign.  No scalp laceration.  No hemotympanum.  Face nontender to palpation.  Eyes: Pupils are equal, round, and reactive to light. Conjunctivae and EOM are normal. Right eye exhibits no discharge. Left eye exhibits no discharge.  Neck:  Normal range of motion. Neck supple.  No midline cervical spine tenderness.  Cardiovascular: Normal rate and regular rhythm.  Murmur (systolic) heard. Pulmonary/Chest: Effort normal. No respiratory distress.  Abdominal: Soft. There is no tenderness.  Musculoskeletal: Normal range of motion.  Neurological: She is alert and oriented to person, place, and time. Coordination normal.  Mental Status:  Alert, oriented, thought content appropriate, able to give a coherent history. Speech fluent without evidence of aphasia. Able to follow 2 step commands without difficulty.  Cranial Nerves:  II:  Peripheral visual fields grossly normal, pupils equal, round, reactive to light III,IV, VI: ptosis not present, extra-ocular motions intact bilaterally  V,VII: smile symmetric, facial light touch sensation equal VIII: hearing grossly normal to voice  X: uvula elevates symmetrically  XI: bilateral shoulder shrug symmetric and strong XII: midline tongue  extension without fassiculations Motor:  Normal tone. 5/5 in upper and lower extremities bilaterally including strong and equal grip strength and dorsiflexion/plantar flexion Sensory: Pinprick and light touch normal in all extremities.  Cerebellar: normal finger-to-nose with bilateral upper extremities Gait: normal gait and balance  Skin: Skin is warm and dry. Capillary refill takes less than 2 seconds. She is not diaphoretic.  Psychiatric: She has a normal mood and affect. Her behavior is normal.  Nursing note and vitals reviewed.    ED Treatments / Results  Labs (all labs ordered are listed, but only abnormal results are displayed) Labs Reviewed - No data to display  EKG None  Radiology Ct Head Wo Contrast  Result Date: 11/11/2017 CLINICAL DATA:  Larey SeatFell and hit head this morning. EXAM: CT HEAD WITHOUT CONTRAST TECHNIQUE: Contiguous axial images were obtained from the base of the skull through the vertex without intravenous contrast. COMPARISON:  Head CT 10/22/2017 FINDINGS: Brain: The ventricles are normal in size and configuration. No extra-axial fluid collections are identified. The gray-white differentiation is maintained. No CT findings for acute hemispheric infarction or intracranial hemorrhage. No mass lesions. The brainstem and cerebellum are normal. Vascular: No hyperdense vessels or obvious aneurysm. Skull: No acute skull fracture.  No bone lesion. Sinuses/Orbits: The paranasal sinuses and mastoid air cells are clear. The globes are intact. Other: No scalp lesions, laceration or hematoma. IMPRESSION: Normal head CT. Electronically Signed   By: Rudie MeyerP.  Gallerani M.D.   On: 11/11/2017 14:07    Procedures Procedures (including critical care time)  Medications Ordered in ED Medications - No data to display   Initial Impression / Assessment and Plan / ED Course  I have reviewed the triage vital signs and the nursing notes.  Pertinent labs & imaging results that were available  during my care of the patient were reviewed by me and considered in my medical decision making (see chart for details).     Patient on Pradaxa presents to the emergency department for evaluation after hitting her head against a nightstand earlier today.  On exam no neurological deficits.  No scalp wound.  She is answering questions appropriately.  Able to ambulate independently without difficulty.  CT head negative for acute intracranial abnormality.  Patient declines medication for headache, states that this is typical of her daily headache and unchanged from baseline.  Have counseled her on return precautions and she agrees and voiced understanding to the above plan and appears reliable for follow-up.  Final Clinical Impressions(s) / ED Diagnoses   Final diagnoses:  Fall, initial encounter    ED Discharge Orders    None       Kellie ShropshireShrosbree, Corie Vavra J,  PA-C 11/11/17 2159    Arby Barrette, MD 11/11/17 2358

## 2017-11-11 NOTE — ED Triage Notes (Signed)
Pt presents for evaluation of hitting heading on nightstand this morning when she was having a nightmare and rolled out of the bed. Pt reports on pradaxa, states she feels cloudy.

## 2017-11-11 NOTE — Discharge Instructions (Addendum)
The CT scan of your head was reassuring, no head bleed.  Your neurological exam was also reassuring.  Please return to the ER if you have any new or concerning symptoms like worst headache of your life, trouble with your vision, new numbness or weakness, difficulty walking, confusion, difficulty speaking.

## 2017-11-11 NOTE — ED Notes (Signed)
Patient transported to CT 

## 2017-11-11 NOTE — ED Notes (Signed)
Pt verbalized understanding discharge instructions and denies any further needs or questions at this time. VS stable, ambulatory and steady gait.   See EDP assessment / note.   

## 2017-11-21 ENCOUNTER — Telehealth: Payer: Self-pay | Admitting: Emergency Medicine

## 2017-11-21 NOTE — Telephone Encounter (Signed)
Pt called to schedule follow up with Dr Elvera LennoxGherghe for Hyponatremia. She has not been seen since 2017. She is schedule for 03/19/18 at 3:00pm. She is asking to switch providers so that she can get in to be seen sooner. Please advise thanks.

## 2017-11-21 NOTE — Telephone Encounter (Signed)
Ok with me for next avail new pt appt.

## 2017-11-21 NOTE — Telephone Encounter (Signed)
She will have to be seen as a new patient, 45-minute visit

## 2017-11-21 NOTE — Telephone Encounter (Signed)
I have called and scheduled patient for tomorrow at 3:15 for 30 minute visit. She is New PT to Dr. Everardo AllEllison, but was once seen by Dr. Elvera LennoxGherghe. It has been since 2017 that she was seen.

## 2017-11-22 ENCOUNTER — Encounter: Payer: Self-pay | Admitting: Endocrinology

## 2017-11-22 ENCOUNTER — Ambulatory Visit (INDEPENDENT_AMBULATORY_CARE_PROVIDER_SITE_OTHER): Payer: Managed Care, Other (non HMO) | Admitting: Endocrinology

## 2017-11-22 VITALS — BP 130/80 | HR 71 | Ht 62.0 in | Wt 154.0 lb

## 2017-11-22 DIAGNOSIS — E871 Hypo-osmolality and hyponatremia: Secondary | ICD-10-CM | POA: Diagnosis not present

## 2017-11-22 DIAGNOSIS — R51 Headache: Secondary | ICD-10-CM | POA: Diagnosis not present

## 2017-11-22 MED ORDER — SODIUM CHLORIDE 1 G PO TABS
4.0000 g | ORAL_TABLET | Freq: Every day | ORAL | 11 refills | Status: DC
Start: 2017-11-22 — End: 2018-05-27

## 2017-11-22 NOTE — Patient Instructions (Addendum)
Please continue to limit fluid intake to less than 2 liters per day.   I have sent a prescription to your pharmacy, for salt tabs, 4 grams per day.   Please come back to recheck the blood test in 2-3 weeks.   Please come back for a follow-up appointment in 6 months

## 2017-11-22 NOTE — Progress Notes (Signed)
Subjective:    Patient ID: Julie Stone, female    DOB: May 03, 1965, 52 y.o.   MRN: [id number]  HPI Pt returns for f/u of hyponatremia (dx'ed 2010; no cause was found; USG and VP were undetectable; pituitary MRI was normal; she takes declomycin).  Main symptom is fatigue and excessive diaphoresis.  She has reduced fluid intake to < 2 liters per day.  She takes NaCl tabs, 4-8 per day.   Past Medical History:  Diagnosis Date  . Carotid artery occlusion   . Depression   . 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 GERD (gastroesophageal reflux disease)   . Heart murmur   . 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 Hx of blood clots    Pt had blood clot in right thumb  . Migraines    4/week - does have botox injections    Past Surgical History:  Procedure Laterality Date  . ABDOMINAL HYSTERECTOMY    . CESAREAN SECTION    . FOOT SURGERY Right   . WRIST SURGERY Right     Social History   Socioeconomic History  . Marital status: Married    Spouse name: Not on file  . Number of children: Not on file  . Years of education: Not on file  . Highest education level: Not on file  Occupational History  . Not on file  Social Needs  . Financial resource strain: Not on file  . Food insecurity:    Worry: Not on file    Inability: Not on file  . Transportation needs:    Medical: Not on file    Non-medical: Not on file  Tobacco Use  . Smoking status: Never Smoker  . Smokeless tobacco: Never Used  Substance and Sexual Activity  . Alcohol use: Yes    Alcohol/week: 1.2 oz    Types: 2 Glasses of wine per week  . Drug use: No  . Sexual activity: Not on file  Lifestyle  . Physical activity:    Days per week: Not on file    Minutes per session: Not on file  . Stress: Not on file  Relationships  . Social connections:    Talks on phone: Not on file    Gets together: Not on file    Attends  religious service: Not on file    Active member of club or organization: Not on file    Attends meetings of clubs or organizations: Not on file    Relationship status: Not on file  . Intimate partner violence:    Fear of current or ex partner: Not on file    Emotionally abused: Not on file    Physically abused: Not on file    Forced sexual activity: Not on file  Other Topics Concern  . Not on file  Social History Narrative   Married   2 daughters   Exercise: weight lifting 2x weekly; some cardio    Current Outpatient Medications on File Prior to Visit  Medication Sig Dispense Refill  . aspirin-acetaminophen-caffeine (EXCEDRIN MIGRAINE) 250-250-65 MG per tablet Take 2 tablets by mouth every 4 (four) hours as needed for migraine.     Marland Kitchen buPROPion (WELLBUTRIN XL) 150 MG 24 hr tablet TAKE 1 TABLET EVERY MORNING WITH 300 MG TO EQUEL 450 MG  3  . clonazePAM (KLONOPIN) 0.5 MG tablet     . dabigatran (PRADAXA) 150 MG CAPS capsule Take 150  mg by mouth 2 (two) times daily.    Marland Kitchen donepezil (ARICEPT) 10 MG tablet Take 10 mg by mouth daily.    . fluticasone (FLONASE) 50 MCG/ACT nasal spray Place 2 sprays into both nostrils daily.    . Fremanezumab-vfrm 225 MG/1.5ML SOSY Inject into the skin.    Marland Kitchen ketoconazole (NIZORAL) 2 % shampoo     . ketorolac (TORADOL) 10 MG tablet     . Melatonin 5 MG TABS Take 5-10 mg by mouth at bedtime.    . montelukast (SINGULAIR) 10 MG tablet Take 10 mg by mouth at bedtime.    . Multiple Vitamin (MULTIVITAMIN WITH MINERALS) TABS tablet Take 1 tablet by mouth daily.    . naratriptan (AMERGE) 2.5 MG tablet TAKE 1 TABLET AS NEDEED FOR MIGRAINE, CAN REPEAT IN 2 HOURS IF NEEDED  3  . PRESCRIPTION MEDICATION Apply 1 application topically every 3 (three) hours as needed (migraines). Promethazine topical compound ([facility])    . propranolol ER (INDERAL LA) 120 MG 24 hr capsule TAKE 1 CAPSULE (120 MG TOTAL) BY MOUTH DAILY. FOR MIGRAINE CONTROL  3  . tiZANidine  (ZANAFLEX) 4 MG tablet TAKE 1 TABLET (4 MG TOTAL) BY MOUTH 4 TIMES DAILY. AS DIRECTED  3  . venlafaxine XR (EFFEXOR-XR) 150 MG 24 hr capsule Take 300 mg by mouth daily.    . Ciclopirox 1 % shampoo Apply topically.    Creig Hines MAINTENANCE PACK 10 MCG INST      No current facility-administered medications on file prior to visit.     Allergies  Allergen Reactions  . Morphine Itching  . Sulfa Antibiotics Swelling and Nausea And Vomiting  . Sulfamethoxazole     Other reaction(s): Facial Edema (intolerance)  . Chlorhexidine Gluconate Itching  . Darvon [Propoxyphene Hcl] Nausea And Vomiting and Other (See Comments)    Passed out  . Morphine And Related Itching  . Triptans   . Erenumab-Aooe Itching, Rash and Hives  . Propoxyphene Nausea Only    Family History  Problem Relation Age of Onset  . Hypertension Mother   . Migraines Father   . Depression Father   . Breast cancer Other 20  . Other Neg Hx        hyponatremia    BP 130/80 (BP Location: Right Arm, Patient Position: Sitting, Cuff Size: Normal)   Pulse 71   Ht 5\' 2"  (1.575 m)   Wt 154 lb (69.9 kg)   SpO2 97%   BMI 28.17 kg/m    Review of Systems She has chronic nausea and intermitt headache.      Objective:   Physical Exam VITAL SIGNS:  See vs page GENERAL: no distress NECK: There is no palpable thyroid enlargement.  No thyroid nodule is palpable.  No palpable lymphadenopathy at the anterior neck. LUNGS:  Clear to auscultation HEART:  Regular rate and rhythm without murmurs noted. Normal S1,S2.   Ext: no leg edema. Gait: normal and steady. PSYCH: Alert and well-oriented.  Does not appear anxious nor depressed.   Lab Results  Component Value Date   CREATININE 0.81 10/22/2017   BUN 8 10/22/2017   NA 129 (L) 10/22/2017   K 4.3 10/22/2017   CL 90 (L) 10/22/2017   CO2 28 10/22/2017       Assessment & Plan:  Hyponatremia: worse.   Headache: poss exac by hyponatremia   Patient Instructions  Please  continue to limit fluid intake to less than 2 liters per day.   I have  sent a prescription to your pharmacy, for salt tabs, 4 grams per day.   Please come back to recheck the blood test in 2-3 weeks.   Please come back for a follow-up appointment in 6 months

## 2017-11-23 ENCOUNTER — Telehealth: Payer: Self-pay | Admitting: Endocrinology

## 2017-11-23 NOTE — Telephone Encounter (Signed)
[facility] needs office notes faxed over from the 8/1 visit.  Fax- [phone]

## 2017-11-26 NOTE — Telephone Encounter (Signed)
[facility]

## 2017-12-07 ENCOUNTER — Other Ambulatory Visit (INDEPENDENT_AMBULATORY_CARE_PROVIDER_SITE_OTHER): Payer: Managed Care, Other (non HMO)

## 2017-12-07 DIAGNOSIS — E871 Hypo-osmolality and hyponatremia: Secondary | ICD-10-CM | POA: Diagnosis not present

## 2017-12-07 LAB — BASIC METABOLIC PANEL
BUN: 14 mg/dL (ref 6–23)
CALCIUM: 9.5 mg/dL (ref 8.4–10.5)
CO2: 31 mEq/L (ref 19–32)
Chloride: 100 mEq/L (ref 96–112)
Creatinine, Ser: 0.86 mg/dL (ref 0.40–1.20)
GFR: 73.08 mL/min (ref >60.00)
Glucose, Bld: 97 mg/dL (ref 70–99)
POTASSIUM: 4.6 meq/L (ref 3.5–5.1)
Sodium: 134 mEq/L — ABNORMAL LOW (ref 135–145)

## 2017-12-10 ENCOUNTER — Telehealth: Payer: Self-pay | Admitting: Endocrinology

## 2017-12-10 NOTE — Telephone Encounter (Signed)
Patient would like to know the results of her labs that she had done Friday.  Please advise

## 2017-12-10 NOTE — Telephone Encounter (Signed)
I called but was unable to leave VM for patient.

## 2018-01-09 ENCOUNTER — Ambulatory Visit: Payer: Self-pay | Admitting: Endocrinology

## 2018-03-19 ENCOUNTER — Ambulatory Visit: Payer: Self-pay | Admitting: Internal Medicine

## 2018-03-19 ENCOUNTER — Encounter

## 2018-05-23 ENCOUNTER — Other Ambulatory Visit: Payer: Self-pay | Admitting: Family Medicine

## 2018-05-23 DIAGNOSIS — M8580 Other specified disorders of bone density and structure, unspecified site: Secondary | ICD-10-CM

## 2018-05-27 ENCOUNTER — Telehealth: Payer: Self-pay

## 2018-05-27 ENCOUNTER — Ambulatory Visit: Payer: Self-pay | Admitting: Endocrinology

## 2018-05-27 ENCOUNTER — Encounter: Payer: Self-pay | Admitting: Endocrinology

## 2018-05-27 VITALS — BP 128/78 | HR 64 | Ht 62.0 in | Wt 161.0 lb

## 2018-05-27 DIAGNOSIS — E871 Hypo-osmolality and hyponatremia: Secondary | ICD-10-CM

## 2018-05-27 LAB — BASIC METABOLIC PANEL
BUN: 14 mg/dL (ref 6–23)
CO2: 28 mEq/L (ref 19–32)
CREATININE: 0.75 mg/dL (ref 0.40–1.20)
Calcium: 9.4 mg/dL (ref 8.4–10.5)
Chloride: 97 mEq/L (ref 96–112)
GFR: 80.38 mL/min (ref >60.00)
GLUCOSE: 81 mg/dL (ref 70–99)
POTASSIUM: 4.2 meq/L (ref 3.5–5.1)
Sodium: 131 mEq/L — ABNORMAL LOW (ref 135–145)

## 2018-05-27 LAB — CORTISOL
Cortisol, Plasma: 5.4 ug/dL
Cortisol, Plasma: 24.2 ug/dL

## 2018-05-27 LAB — TSH: TSH: 2.22 u[IU]/mL (ref 0.35–4.50)

## 2018-05-27 MED ORDER — SODIUM CHLORIDE 1 G PO TABS: 8.0000 g | ORAL_TABLET | Freq: Every day | ORAL | 11 refills | Status: AC

## 2018-05-27 MED ORDER — COSYNTROPIN 0.25 MG IJ SOLR
0.2500 mg | Freq: Once | INTRAMUSCULAR | Status: AC
  Administered 2018-05-27: 0.25 mg via INTRAMUSCULAR

## 2018-05-27 NOTE — Telephone Encounter (Signed)
done

## 2018-05-27 NOTE — Progress Notes (Signed)
Subjective:    Patient ID: Julie Stone, female    DOB: [birth year], 53 y.o.   MRN: [id number]  HPI Pt returns for f/u of hyponatremia (dx'ed 2010; no cause was found; USG and VP were undetectable, so declomycin is felt to be of no potential benefit; pituitary MRI was normal; she has never taken declomycin).  pt states she feels better in general recently.  She says she drinks approx 2 quarts of fluid per day.  She takes NaCl tabs, 4-8 per day.   Past Medical History:  Diagnosis Date  . Carotid artery occlusion   . Depression   . 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 GERD (gastroesophageal reflux disease)   . Heart murmur   . 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 Hx of blood clots    Pt had blood clot in right thumb  . Migraines    4/week - does have botox injections    Past Surgical History:  Procedure Laterality Date  . ABDOMINAL HYSTERECTOMY    . CESAREAN SECTION    . FOOT SURGERY Right   . WRIST SURGERY Right     Social History   Socioeconomic History  . Marital status: Married    Spouse name: Not on file  . Number of children: Not on file  . Years of education: Not on file  . Highest education level: Not on file  Occupational History  . Not on file  Social Needs  . Financial resource strain: Not on file  . Food insecurity:    Worry: Not on file    Inability: Not on file  . Transportation needs:    Medical: Not on file    Non-medical: Not on file  Tobacco Use  . Smoking status: Never Smoker  . Smokeless tobacco: Never Used  Substance and Sexual Activity  . Alcohol use: Yes    Alcohol/week: 2.0 standard drinks    Types: 2 Glasses of wine per week  . Drug use: No  . Sexual activity: Not on file  Lifestyle  . Physical activity:    Days per week: Not on file    Minutes per session: Not on file  . Stress: Not on file  Relationships  . Social connections:      Talks on phone: Not on file    Gets together: Not on file    Attends religious service: Not on file    Active member of club or organization: Not on file    Attends meetings of clubs or organizations: Not on file    Relationship status: Not on file  . Intimate partner violence:    Fear of current or ex partner: Not on file    Emotionally abused: Not on file    Physically abused: Not on file    Forced sexual activity: Not on file  Other Topics Concern  . Not on file  Social History Narrative   Married   2 daughters   Exercise: weight lifting 2x weekly; some cardio    Current Outpatient Medications on File Prior to Visit  Medication Sig Dispense Refill  . buPROPion (WELLBUTRIN XL) 150 MG 24 hr tablet TAKE 1 TABLET EVERY MORNING WITH 300 MG TO EQUEL 450 MG  3  . Ciclopirox 1 % shampoo Apply topically.    . clonazePAM (KLONOPIN) 0.5 MG tablet     . dabigatran (PRADAXA) 150 MG CAPS capsule Take 150  mg by mouth 2 (two) times daily.    Marland Kitchen donepezil (ARICEPT) 10 MG tablet Take 10 mg by mouth daily.    Marland Kitchen FLUoxetine (PROZAC) 40 MG capsule Take 40 mg by mouth daily.    . fluticasone (FLONASE) 50 MCG/ACT nasal spray Place 2 sprays into both nostrils daily.    . Fremanezumab-vfrm 225 MG/1.5ML SOSY Inject into the skin.    Creig Hines MAINTENANCE PACK 10 MCG INST     . ketoconazole (NIZORAL) 2 % shampoo     . ketorolac (TORADOL) 10 MG tablet     . Melatonin 5 MG TABS Take 5-10 mg by mouth at bedtime.    . montelukast (SINGULAIR) 10 MG tablet Take 10 mg by mouth at bedtime.    . Multiple Vitamin (MULTIVITAMIN WITH MINERALS) TABS tablet Take 1 tablet by mouth daily.    Marland Kitchen PRESCRIPTION MEDICATION Apply 1 application topically every 3 (three) hours as needed (migraines). Promethazine topical compound ([facility])    . propranolol ER (INDERAL LA) 120 MG 24 hr capsule TAKE 1 CAPSULE (120 MG TOTAL) BY MOUTH DAILY. FOR MIGRAINE CONTROL  3  . tiZANidine (ZANAFLEX) 4 MG tablet TAKE 1 TABLET (4  MG TOTAL) BY MOUTH 4 TIMES DAILY. AS DIRECTED  3  . venlafaxine XR (EFFEXOR-XR) 150 MG 24 hr capsule Take 300 mg by mouth daily.     No current facility-administered medications on file prior to visit.     Allergies  Allergen Reactions  . Morphine Itching  . Sulfa Antibiotics Swelling and Nausea And Vomiting  . Sulfamethoxazole     Other reaction(s): Facial Edema (intolerance)  . Chlorhexidine Gluconate Itching  . Darvon [Propoxyphene Hcl] Nausea And Vomiting and Other (See Comments)    Passed out  . Morphine And Related Itching  . Triptans   . Erenumab-Aooe Itching, Rash and Hives  . Propoxyphene Nausea Only    Family History  Problem Relation Age of Onset  . Hypertension Mother   . Migraines Father   . Depression Father   . Breast cancer Other 20  . Other Neg Hx        hyponatremia    BP 128/78 (BP Location: Left Arm, Patient Position: Sitting, Cuff Size: Normal)   Pulse 64   Ht 5\' 2"  (1.575 m)   Wt 161 lb (73 kg)   SpO2 97%   BMI 29.45 kg/m    Review of Systems Denies n/v.     Objective:   Physical Exam VITAL SIGNS:  See vs page GENERAL: no distress Ext: no leg edema.     Lab Results  Component Value Date   CREATININE 0.75 05/27/2018   BUN 14 05/27/2018   NA 131 (L) 05/27/2018   K 4.2 05/27/2018   CL 97 05/27/2018   CO2 28 05/27/2018      Assessment & Plan:  Hyponatremia: worse: increase NaCl to 8 grams/day.  Patient Instructions  Please try to limit fluid intake to less than 2 liters per day.   Blood tests are requested for you today.  We'll let you know about the results.   Please come back for a follow-up appointment in 6 months.

## 2018-05-27 NOTE — Telephone Encounter (Signed)
Lab tech in our office asked that you please enter order for 2nd lab draw.

## 2018-05-27 NOTE — Patient Instructions (Addendum)
Please try to limit fluid intake to less than 2 liters per day.   Blood tests are requested for you today.  We'll let you know about the results.   Please come back for a follow-up appointment in 6 months.

## 2018-05-31 ENCOUNTER — Telehealth: Payer: Self-pay | Admitting: Endocrinology

## 2018-05-31 ENCOUNTER — Other Ambulatory Visit: Payer: Self-pay | Admitting: Family Medicine

## 2018-05-31 DIAGNOSIS — Z1231 Encounter for screening mammogram for malignant neoplasm of breast: Secondary | ICD-10-CM

## 2018-05-31 NOTE — Telephone Encounter (Signed)
Patient called 2nd time today requesting to be called with her lab results at ph# [phone].

## 2018-05-31 NOTE — Telephone Encounter (Signed)
Patient called re: please call patient at ph# [phone] for test results. If nothing found, patient wants to know course of action, per patient.

## 2018-06-03 NOTE — Telephone Encounter (Signed)
Letter sent to patient.

## 2018-06-03 NOTE — Telephone Encounter (Signed)
Attempted to call pt back again, and pt did not answer, however, a letter has already been mailed with lab results to this patient.

## 2018-07-16 ENCOUNTER — Other Ambulatory Visit: Payer: Self-pay

## 2018-09-12 ENCOUNTER — Other Ambulatory Visit: Payer: Self-pay | Admitting: Orthopedic Surgery

## 2018-09-26 ENCOUNTER — Other Ambulatory Visit: Payer: Self-pay

## 2018-09-26 ENCOUNTER — Encounter (HOSPITAL_BASED_OUTPATIENT_CLINIC_OR_DEPARTMENT_OTHER): Payer: Self-pay | Admitting: *Deleted

## 2018-09-26 NOTE — Progress Notes (Signed)
PAT phone call complete. Instructed pt that she will need to call and verify with her PCP who  prescribes her Pradaxa to have him or her address when to STOP before her surgery and when she can RESTART after surgery. Also will follow up with Tammy, RN  PAT on protocol for drawing PT/INR before surgery.

## 2018-09-30 ENCOUNTER — Other Ambulatory Visit (HOSPITAL_COMMUNITY)
Admission: RE | Admit: 2018-09-30 | Discharge: 2018-09-30 | Disposition: A | Discharge status: Home / Self Care | Payer: BLUE CROSS/BLUE SHIELD | Source: Ambulatory Visit | Attending: Orthopedic Surgery | Admitting: Orthopedic Surgery

## 2018-09-30 DIAGNOSIS — Z1159 Encounter for screening for other viral diseases: Secondary | ICD-10-CM | POA: Insufficient documentation

## 2018-10-01 LAB — NOVEL CORONAVIRUS, NAA (HOSP ORDER, SEND-OUT TO REF LAB; TAT 18-24 HRS): SARS-CoV-2, NAA: NOT DETECTED

## 2018-10-02 NOTE — Progress Notes (Signed)
Pre-surgery drink given with instructions.  Patient verbalized understanding.

## 2018-10-03 ENCOUNTER — Ambulatory Visit (HOSPITAL_BASED_OUTPATIENT_CLINIC_OR_DEPARTMENT_OTHER): Payer: BLUE CROSS/BLUE SHIELD | Admitting: Anesthesiology

## 2018-10-03 ENCOUNTER — Other Ambulatory Visit: Payer: Self-pay

## 2018-10-03 ENCOUNTER — Ambulatory Visit (HOSPITAL_BASED_OUTPATIENT_CLINIC_OR_DEPARTMENT_OTHER)
Admission: RE | Admit: 2018-10-03 | Discharge: 2018-10-03 | Disposition: A | Discharge status: Home / Self Care | Payer: BLUE CROSS/BLUE SHIELD | Attending: Orthopedic Surgery | Admitting: Orthopedic Surgery

## 2018-10-03 ENCOUNTER — Encounter (HOSPITAL_BASED_OUTPATIENT_CLINIC_OR_DEPARTMENT_OTHER): Payer: Self-pay | Admitting: *Deleted

## 2018-10-03 ENCOUNTER — Encounter (HOSPITAL_BASED_OUTPATIENT_CLINIC_OR_DEPARTMENT_OTHER)
Admission: RE | Disposition: A | Discharge status: Home / Self Care | Payer: Self-pay | Source: Home / Self Care | Attending: Orthopedic Surgery

## 2018-10-03 DIAGNOSIS — Z86718 Personal history of other venous thrombosis and embolism: Secondary | ICD-10-CM | POA: Insufficient documentation

## 2018-10-03 DIAGNOSIS — M67441 Ganglion, right hand: Secondary | ICD-10-CM | POA: Diagnosis not present

## 2018-10-03 DIAGNOSIS — G7102 Facioscapulohumeral muscular dystrophy: Secondary | ICD-10-CM | POA: Diagnosis not present

## 2018-10-03 DIAGNOSIS — D6859 Other primary thrombophilia: Secondary | ICD-10-CM | POA: Insufficient documentation

## 2018-10-03 DIAGNOSIS — G43909 Migraine, unspecified, not intractable, without status migrainosus: Secondary | ICD-10-CM | POA: Diagnosis not present

## 2018-10-03 DIAGNOSIS — Z79899 Other long term (current) drug therapy: Secondary | ICD-10-CM | POA: Insufficient documentation

## 2018-10-03 DIAGNOSIS — M19041 Primary osteoarthritis, right hand: Secondary | ICD-10-CM | POA: Insufficient documentation

## 2018-10-03 DIAGNOSIS — Z7901 Long term (current) use of anticoagulants: Secondary | ICD-10-CM | POA: Diagnosis not present

## 2018-10-03 DIAGNOSIS — F329 Major depressive disorder, single episode, unspecified: Secondary | ICD-10-CM | POA: Diagnosis not present

## 2018-10-03 HISTORY — DX: Other specified postprocedural states: R11.2

## 2018-10-03 HISTORY — DX: Other specified postprocedural states: Z98.890

## 2018-10-03 HISTORY — PX: I & D EXTREMITY: SHX5045

## 2018-10-03 HISTORY — PX: CYST EXCISION: SHX5701

## 2018-10-03 SURGERY — CYST REMOVAL
Anesthesia: Regional | Site: Finger | Laterality: Right

## 2018-10-03 MED ORDER — CEFAZOLIN SODIUM-DEXTROSE 2-4 GM/100ML-% IV SOLN
INTRAVENOUS | Status: AC
  Filled 2018-10-03: qty 100

## 2018-10-03 MED ORDER — FENTANYL CITRATE (PF) 100 MCG/2ML IJ SOLN
50.0000 ug | INTRAMUSCULAR | Status: DC | PRN
  Administered 2018-10-03: 100 ug via INTRAVENOUS

## 2018-10-03 MED ORDER — MIDAZOLAM HCL 2 MG/2ML IJ SOLN
INTRAMUSCULAR | Status: AC
  Filled 2018-10-03: qty 2

## 2018-10-03 MED ORDER — FENTANYL CITRATE (PF) 100 MCG/2ML IJ SOLN
INTRAMUSCULAR | Status: AC
  Filled 2018-10-03: qty 2

## 2018-10-03 MED ORDER — LACTATED RINGERS IV SOLN
INTRAVENOUS | Status: DC
  Administered 2018-10-03: 09:00:00 via INTRAVENOUS

## 2018-10-03 MED ORDER — MIDAZOLAM HCL 2 MG/2ML IJ SOLN
1.0000 mg | INTRAMUSCULAR | Status: DC | PRN
  Administered 2018-10-03: 2 mg via INTRAVENOUS

## 2018-10-03 MED ORDER — HYDROCODONE-ACETAMINOPHEN 5-325 MG PO TABS: ORAL_TABLET | ORAL | 0 refills | Status: DC

## 2018-10-03 MED ORDER — 0.9 % SODIUM CHLORIDE (POUR BTL) OPTIME
TOPICAL | Status: DC | PRN
  Administered 2018-10-03: 12:00:00 1000 mL

## 2018-10-03 MED ORDER — BUPIVACAINE HCL (PF) 0.25 % IJ SOLN
INTRAMUSCULAR | Status: DC | PRN
  Administered 2018-10-03: 10 mL

## 2018-10-03 MED ORDER — LIDOCAINE HCL (PF) 0.5 % IJ SOLN
INTRAMUSCULAR | Status: DC | PRN
  Administered 2018-10-03: 30 mL via INTRAVENOUS

## 2018-10-03 MED ORDER — FENTANYL CITRATE (PF) 100 MCG/2ML IJ SOLN: 25.0000 ug | INTRAMUSCULAR | Status: DC | PRN

## 2018-10-03 MED ORDER — SCOPOLAMINE 1 MG/3DAYS TD PT72: 1.0000 | MEDICATED_PATCH | Freq: Once | TRANSDERMAL | Status: DC | PRN

## 2018-10-03 MED ORDER — ONDANSETRON HCL 4 MG/2ML IJ SOLN
INTRAMUSCULAR | Status: DC | PRN
  Administered 2018-10-03: 4 mg via INTRAVENOUS

## 2018-10-03 MED ORDER — ONDANSETRON HCL 4 MG/2ML IJ SOLN: 4.0000 mg | Freq: Once | INTRAMUSCULAR | Status: DC | PRN

## 2018-10-03 MED ORDER — PROPOFOL 10 MG/ML IV BOLUS
INTRAVENOUS | Status: DC | PRN
  Administered 2018-10-03 (×2): 10 mg via INTRAVENOUS
  Administered 2018-10-03: 20 mg via INTRAVENOUS

## 2018-10-03 MED ORDER — CEFAZOLIN SODIUM-DEXTROSE 2-4 GM/100ML-% IV SOLN
2.0000 g | INTRAVENOUS | Status: AC
  Administered 2018-10-03: 2 g via INTRAVENOUS

## 2018-10-03 MED ORDER — OXYCODONE HCL 5 MG PO TABS: 5.0000 mg | ORAL_TABLET | Freq: Once | ORAL | Status: DC | PRN

## 2018-10-03 MED ORDER — OXYCODONE HCL 5 MG/5ML PO SOLN: 5.0000 mg | Freq: Once | ORAL | Status: DC | PRN

## 2018-10-03 SURGICAL SUPPLY — 63 items
APL PRP STRL LF DISP 70% ISPRP (MISCELLANEOUS) ×1
APL SKNCLS STERI-STRIP NONHPOA (GAUZE/BANDAGES/DRESSINGS)
BAG DECANTER FOR FLEXI CONT (MISCELLANEOUS) IMPLANT
BANDAGE ACE 3X5.8 VEL STRL LF (GAUZE/BANDAGES/DRESSINGS) IMPLANT
BENZOIN TINCTURE PRP APPL 2/3 (GAUZE/BANDAGES/DRESSINGS) IMPLANT
BLADE MINI RND TIP GREEN BEAV (BLADE) IMPLANT
BLADE SURG 15 STRL LF DISP TIS (BLADE) ×2 IMPLANT
BLADE SURG 15 STRL SS (BLADE) ×4
BNDG CMPR 9X4 STRL LF SNTH (GAUZE/BANDAGES/DRESSINGS)
BNDG COHESIVE 1X5 TAN NS LF (GAUZE/BANDAGES/DRESSINGS) ×2 IMPLANT
BNDG COHESIVE 1X5 TAN STRL LF (GAUZE/BANDAGES/DRESSINGS) IMPLANT
BNDG COHESIVE 2X5 TAN STRL LF (GAUZE/BANDAGES/DRESSINGS) IMPLANT
BNDG CONFORM 2 STRL LF (GAUZE/BANDAGES/DRESSINGS) IMPLANT
BNDG ELASTIC 2X5.8 VLCR STR LF (GAUZE/BANDAGES/DRESSINGS) IMPLANT
BNDG ESMARK 4X9 LF (GAUZE/BANDAGES/DRESSINGS) IMPLANT
BNDG GAUZE 1X2.1 STRL (MISCELLANEOUS) IMPLANT
BNDG GAUZE ELAST 4 BULKY (GAUZE/BANDAGES/DRESSINGS) IMPLANT
BNDG PLASTER X FAST 3X3 WHT LF (CAST SUPPLIES) IMPLANT
BNDG PLSTR 9X3 FST ST WHT (CAST SUPPLIES)
BRUSH SCRUB EZ PLAIN DRY (MISCELLANEOUS) ×2 IMPLANT
CHLORAPREP W/TINT 26 (MISCELLANEOUS) ×2 IMPLANT
CORD BIPOLAR FORCEPS 12FT (ELECTRODE) ×2 IMPLANT
COVER BACK TABLE REUSABLE LG (DRAPES) ×2 IMPLANT
COVER MAYO STAND REUSABLE (DRAPES) ×2 IMPLANT
COVER WAND RF STERILE (DRAPES) IMPLANT
CUFF TOURN SGL QUICK 18X4 (TOURNIQUET CUFF) ×2 IMPLANT
DRAPE EXTREMITY T 121X128X90 (DISPOSABLE) ×2 IMPLANT
DRAPE SURG 17X23 STRL (DRAPES) ×2 IMPLANT
GAUZE PACKING IODOFORM 1/4X15 (GAUZE/BANDAGES/DRESSINGS) IMPLANT
GAUZE SPONGE 4X4 12PLY STRL (GAUZE/BANDAGES/DRESSINGS) ×2 IMPLANT
GAUZE SPONGE 4X4 12PLY STRL LF (GAUZE/BANDAGES/DRESSINGS) ×1 IMPLANT
GAUZE XEROFORM 1X8 LF (GAUZE/BANDAGES/DRESSINGS) ×2 IMPLANT
GLOVE BIO SURGEON STRL SZ7.5 (GLOVE) ×2 IMPLANT
GLOVE BIOGEL PI IND STRL 8 (GLOVE) ×1 IMPLANT
GLOVE BIOGEL PI INDICATOR 8 (GLOVE) ×1
GOWN STRL REUS W/ TWL LRG LVL3 (GOWN DISPOSABLE) ×1 IMPLANT
GOWN STRL REUS W/TWL LRG LVL3 (GOWN DISPOSABLE) ×2
GOWN STRL REUS W/TWL XL LVL3 (GOWN DISPOSABLE) ×2 IMPLANT
LOOP VESSEL MAXI BLUE (MISCELLANEOUS) IMPLANT
NDL HYPO 25X1 1.5 SAFETY (NEEDLE) ×1 IMPLANT
NEEDLE HYPO 25X1 1.5 SAFETY (NEEDLE) ×2 IMPLANT
NS IRRIG 1000ML POUR BTL (IV SOLUTION) ×2 IMPLANT
PACK BASIN DAY SURGERY FS (CUSTOM PROCEDURE TRAY) ×2 IMPLANT
PAD CAST 3X4 CTTN HI CHSV (CAST SUPPLIES) IMPLANT
PAD CAST 4YDX4 CTTN HI CHSV (CAST SUPPLIES) IMPLANT
PADDING CAST ABS 4INX4YD NS (CAST SUPPLIES) ×1
PADDING CAST ABS COTTON 4X4 ST (CAST SUPPLIES) ×1 IMPLANT
PADDING CAST COTTON 3X4 STRL (CAST SUPPLIES)
PADDING CAST COTTON 4X4 STRL (CAST SUPPLIES)
SPLINT PLASTER CAST XFAST 3X15 (CAST SUPPLIES) IMPLANT
SPLINT PLASTER XTRA FASTSET 3X (CAST SUPPLIES)
STOCKINETTE 4X48 STRL (DRAPES) ×2 IMPLANT
STRIP CLOSURE SKIN 1/2X4 (GAUZE/BANDAGES/DRESSINGS) IMPLANT
SUT ETHILON 3 0 PS 1 (SUTURE) IMPLANT
SUT ETHILON 4 0 PS 2 18 (SUTURE) ×2 IMPLANT
SWAB COLLECTION DEVICE MRSA (MISCELLANEOUS) IMPLANT
SWAB CULTURE ESWAB REG 1ML (MISCELLANEOUS) IMPLANT
SYR BULB 3OZ (MISCELLANEOUS) ×2 IMPLANT
SYR CONTROL 10ML LL (SYRINGE) ×2 IMPLANT
TOWEL GREEN STERILE FF (TOWEL DISPOSABLE) ×4 IMPLANT
TRAY DSU PREP LF (CUSTOM PROCEDURE TRAY) ×2 IMPLANT
TUBE FEEDING ENTERAL 5FR 16IN (TUBING) IMPLANT
UNDERPAD 30X30 (UNDERPADS AND DIAPERS) ×2 IMPLANT

## 2018-10-03 NOTE — Op Note (Signed)
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  Right long finger excision mucoid cyst 2.  Right long finger debridement of DIP joint   SURGEON:  Leanora Cover, M.D.   ASSISTANT: none   ANESTHESIA:  Bier block with sedation   INTRAVENOUS FLUIDS:  Per anesthesia flow sheet.   ESTIMATED BLOOD LOSS:  Minimal.   COMPLICATIONS:  None.   SPECIMENS:   Right long finger mucoid cyst to pathology   TOURNIQUET TIME:    Total Tourniquet Time Documented: Forearm (Right) - 24 minutes Total: Forearm (Right) - 24 minutes    DISPOSITION:  Stable to PACU.   INDICATIONS: 53 year old female with cyst on right long finger.  This is bothersome to her.  She wishes to have it removed and the DIP joint debrided to try to prevent recurrence. Risks, benefits and alternatives of surgery were discussed including the risks of blood loss, infection, damage to nerves, vessels, tendons, ligaments, bone for surgery, need for additional surgery, complications with wound healing, continued pain, stiffness.  She voiced understanding of these risks and elected to proceed.  OPERATIVE COURSE:  After being identified preoperatively by myself,  the patient and I agreed on the procedure and site of the procedure.  The surgical site was marked.  Surgical consent had been signed. She was given IV antibiotics as preoperative antibiotic prophylaxis. She was transferred to the operating room and placed on the operating table in supine position with the Right Right upper extremity on an arm board.  Bier block anesthesia was induced by the anesthesiologist.  Right upper extremity  was prepped and draped in normal sterile orthopedic fashion.  A surgical pause was performed between the surgeons, anesthesia, and operating room staff and all were in agreement as to the patient, procedure, and site of procedure.  Tourniquet at the proximal aspect of the forearm had been inflated for the Bier block.    A hockey-stick shaped incision was made at the radial side of the long finger at the DIP joint.  This carried in subcutaneous tissues by spreading technique.  The cyst was identified.  It was freed up and removed with the rongeurs.  The DIP joint was accessed underneath the extensor tendon from the radial side.  There was more cyst within the joint.  This was removed with the rondure and sent to pathology for examination.  The joint was debrided using the rongeurs.  There were no distinct bony spicules.  The wound and joint were copiously irrigated with sterile saline.  The wound was then closed with 4-0 nylon in a horizontal mattress fashion.  It was dressed with sterile Xeroform 4 x 4's and wrapped with a Coban dressing lightly.  An AlumaFoam splint was placed and wrapped lightly with Coban dressing.  Digital block was performed with quarter percent plain Marcaine to aid in postoperative analgesia.  The tourniquet was deflated at 24 minutes.  Fingertips were pink with brisk capillary refill after deflation of tourniquet.  The operative  drapes were broken down.  The patient was awoken from anesthesia safely.  She was transferred back to the stretcher and taken to PACU  in stable condition.  I will see her back in the office in 1 week for postoperative followup.  I will give her a prescription for Norco 5/325 1-2 tabs PO q6 hours prn pain, dispense # 20.  She has tramadol at home already and may use that instead if she wishes.  Betha LoaKevin Edder Bellanca, MD Electronically signed, 10/03/18

## 2018-10-03 NOTE — Anesthesia Preprocedure Evaluation (Addendum)
Anesthesia Evaluation  Patient identified by MRN, date of birth, ID band Patient awake    Reviewed: Allergy & Precautions, NPO status , Patient's Chart, lab work & pertinent test results, reviewed documented beta blocker date and time   History of Anesthesia Complications (+) PONVNegative for: history of anesthetic complications  Airway Mallampati: II  TM Distance: >3 FB Neck ROM: Full    Dental   Pulmonary neg pulmonary ROS,    Pulmonary exam normal        Cardiovascular + DVT (on pradaxa)  Normal cardiovascular exam     Neuro/Psych  Headaches, PSYCHIATRIC DISORDERS Depression    GI/Hepatic Neg liver ROS, GERD  ,  Endo/Other  negative endocrine ROS  Renal/GU negative Renal ROS  negative genitourinary   Musculoskeletal negative musculoskeletal ROS (+)   Abdominal   Peds  Hematology negative hematology ROS (+)   Anesthesia Other Findings   Reproductive/Obstetrics                            Anesthesia Physical Anesthesia Plan  ASA: II  Anesthesia Plan: Bier Block and Bier Block-LIDOCAINE ONLY   Post-op Pain Management:    Induction:   PONV Risk Score and Plan: 3 and Ondansetron, Propofol infusion, Dexamethasone and Treatment may vary due to age or medical condition  Airway Management Planned: Natural Airway, Nasal Cannula and Simple Face Mask  Additional Equipment: None  Intra-op Plan:   Post-operative Plan:   Informed Consent: I have reviewed the patients History and Physical, chart, labs and discussed the procedure including the risks, benefits and alternatives for the proposed anesthesia with the patient or authorized representative who has indicated his/her understanding and acceptance.       Plan Discussed with:   Anesthesia Plan Comments:        Anesthesia Quick Evaluation

## 2018-10-03 NOTE — Discharge Instructions (Addendum)

## 2018-10-03 NOTE — Anesthesia Procedure Notes (Signed)
Anesthesia Regional Block: Bier block (IV Regional)   Pre-Anesthetic Checklist: ,, timeout performed, Correct Patient, Correct Site, Correct Laterality, Correct Procedure,, site marked, surgical consent,, at surgeon's request  Laterality: Right     Needles:  Injection technique: Single-shot  Needle Type: Other      Needle Gauge: 20     Additional Needles:   Procedures:,,,,, intact distal pulses, Esmarch exsanguination, single tourniquet utilized,  Narrative:  Start time: 10/03/2018 11:27 AM End time: 10/03/2018 11:27 AM Injection made incrementally with aspirations every 35 mL.  Performed by: Personally

## 2018-10-03 NOTE — Transfer of Care (Signed)
Immediate Anesthesia Transfer of Care Note  Patient: RAMATA STROTHMAN  Procedure(s) Performed: CYST REMOVAL (Right Finger) DEBRIDEMENT DISTAL INTERPHALANGEAL JOINT (Right Finger)  Patient Location: PACU  Anesthesia Type:MAC  Level of Consciousness: awake and alert   Airway & Oxygen Therapy: Patient Spontanous Breathing and Patient connected to face mask oxygen  Post-op Assessment: Report given to RN and Post -op Vital signs reviewed and stable  Post vital signs: Reviewed and stable  Last Vitals:  Vitals Value Taken Time  BP    Temp    Pulse 58 10/03/18 1202  Resp 18 10/03/18 1202  SpO2 99 % 10/03/18 1202  Vitals shown include unvalidated device data.  Last Pain:  Vitals:   10/03/18 0915  TempSrc: Oral  PainSc: 0-No pain         Complications: No apparent anesthesia complications

## 2018-10-03 NOTE — H&P (Signed)
  Julie Stone is an 53 y.o. female.   Chief Complaint: right long finger cyst HPI: 53 yo female with cyst on right long finger and dip joint arthritis.  She wishes to have the cyst removed and the joint debrided to try to prevent recurrence.  Allergies:  Allergies  Allergen Reactions  . Morphine Itching  . Sulfa Antibiotics Swelling and Nausea And Vomiting  . Sulfamethoxazole     Other reaction(s): Facial Edema (intolerance)  . Chlorhexidine Gluconate Itching  . Darvon [Propoxyphene Hcl] Nausea And Vomiting and Other (See Comments)    Passed out  . Morphine And Related Itching  . Triptans   . Erenumab-Aooe Itching, Rash and Hives  . Propoxyphene Nausea Only    Past Medical History:  Diagnosis Date  . Carotid artery occlusion   . Depression   . FMD (facioscapulohumeral muscular dystrophy) ([place]) Aug. 2013   Left side  . GERD (gastroesophageal reflux disease)   . Heart murmur   . 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 Hx of blood clots    Pt had blood clot in right thumb  . Migraines    4/week - does have botox injections  . PONV (postoperative nausea and vomiting)     Past Surgical History:  Procedure Laterality Date  . ABDOMINAL HYSTERECTOMY    . BACK SURGERY    . CESAREAN SECTION    . FOOT SURGERY Right   . WRIST SURGERY Right     Family History: Family History  Problem Relation Age of Onset  . Hypertension Mother   . Migraines Father   . Depression Father   . Breast cancer Other 20  . Other Neg Hx        hyponatremia    Social History:   reports that she has never smoked. She has never used smokeless tobacco. She reports current alcohol use of about 2.0 standard drinks of alcohol per week. She reports that she does not use drugs.  Medications: No medications prior to admission.    No results found for this or any previous visit (from the past 48 hour(s)).  No results found.   A  comprehensive review of systems was negative.  Height 5\' 3"  (1.6 m), weight 68.9 kg.  General appearance: alert, cooperative and appears stated age Head: Normocephalic, without obvious abnormality, atraumatic Neck: supple, symmetrical, trachea midline Cardio: regular rate and rhythm Resp: clear to auscultation bilaterally Extremities: Intact sensation and capillary refill all digits.  +epl/fpl/io.  No wounds.  Pulses: 2+ and symmetric Skin: Skin color, texture, turgor normal. 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  Non operative and operative treatment options have been discussed with the patient and patient wishes to proceed with operative treatment. Risks, benefits, and alternatives of surgery have been discussed and the patient agrees with the plan of care.   Leanora Cover 10/03/2018, 8:44 AM

## 2018-10-04 ENCOUNTER — Encounter (HOSPITAL_BASED_OUTPATIENT_CLINIC_OR_DEPARTMENT_OTHER): Payer: Self-pay | Admitting: Orthopedic Surgery

## 2018-10-04 NOTE — Anesthesia Postprocedure Evaluation (Signed)
Anesthesia Post Note  Patient: Julie Stone  Procedure(s) Performed: CYST REMOVAL (Right Finger) DEBRIDEMENT DISTAL INTERPHALANGEAL JOINT (Right Finger)     Patient location during evaluation: PACU Anesthesia Type: Bier Block Level of consciousness: awake and alert Pain management: pain level controlled Vital Signs Assessment: post-procedure vital signs reviewed and stable Respiratory status: spontaneous breathing, nonlabored ventilation and respiratory function stable Cardiovascular status: blood pressure returned to baseline and stable Postop Assessment: no apparent nausea or vomiting Anesthetic complications: no    Last Vitals:  Vitals:   10/03/18 1300 10/03/18 1315  BP: 112/67 (!) 118/54  Pulse: (!) 52 (!) 54  Resp: 10 16  Temp:  36.6 C  SpO2: 97% 96%    Last Pain:  Vitals:   10/03/18 1315  TempSrc:   PainSc: 0-No pain                 Lidia Collum

## 2018-10-11 ENCOUNTER — Other Ambulatory Visit: Payer: Self-pay

## 2018-10-11 ENCOUNTER — Ambulatory Visit: Payer: Self-pay

## 2018-12-06 ENCOUNTER — Other Ambulatory Visit: Payer: BLUE CROSS/BLUE SHIELD

## 2018-12-06 ENCOUNTER — Ambulatory Visit: Payer: BLUE CROSS/BLUE SHIELD

## 2019-02-07 ENCOUNTER — Other Ambulatory Visit: Payer: BLUE CROSS/BLUE SHIELD

## 2019-02-07 ENCOUNTER — Ambulatory Visit: Payer: BLUE CROSS/BLUE SHIELD

## 2019-03-03 ENCOUNTER — Other Ambulatory Visit: Payer: Self-pay

## 2019-03-03 ENCOUNTER — Ambulatory Visit
Admission: RE | Admit: 2019-03-03 | Discharge: 2019-03-03 | Disposition: A | Discharge status: Home / Self Care | Payer: BLUE CROSS/BLUE SHIELD | Source: Ambulatory Visit | Attending: Family Medicine | Admitting: Family Medicine

## 2019-03-03 DIAGNOSIS — M8580 Other specified disorders of bone density and structure, unspecified site: Secondary | ICD-10-CM

## 2019-03-03 DIAGNOSIS — Z1231 Encounter for screening mammogram for malignant neoplasm of breast: Secondary | ICD-10-CM

## 2019-03-05 ENCOUNTER — Other Ambulatory Visit: Payer: Self-pay | Admitting: Family Medicine

## 2019-03-05 DIAGNOSIS — R928 Other abnormal and inconclusive findings on diagnostic imaging of breast: Secondary | ICD-10-CM

## 2019-03-07 ENCOUNTER — Ambulatory Visit
Admission: RE | Admit: 2019-03-07 | Discharge: 2019-03-07 | Disposition: A | Discharge status: Home / Self Care | Payer: BLUE CROSS/BLUE SHIELD | Source: Ambulatory Visit | Attending: Family Medicine | Admitting: Family Medicine

## 2019-03-07 ENCOUNTER — Other Ambulatory Visit: Payer: Self-pay

## 2019-03-07 DIAGNOSIS — R928 Other abnormal and inconclusive findings on diagnostic imaging of breast: Secondary | ICD-10-CM

## 2019-08-04 ENCOUNTER — Other Ambulatory Visit: Payer: Self-pay | Admitting: Endocrinology

## 2019-09-23 ENCOUNTER — Other Ambulatory Visit: Payer: Self-pay | Admitting: Family Medicine

## 2019-12-18 ENCOUNTER — Other Ambulatory Visit: Payer: Self-pay

## 2019-12-18 ENCOUNTER — Ambulatory Visit (HOSPITAL_COMMUNITY)
Admission: EM | Admit: 2019-12-18 | Discharge: 2019-12-18 | Disposition: A | Discharge status: Home / Self Care | Payer: BLUE CROSS/BLUE SHIELD

## 2020-01-27 ENCOUNTER — Other Ambulatory Visit: Payer: Self-pay | Admitting: Family Medicine

## 2020-01-27 DIAGNOSIS — Z1231 Encounter for screening mammogram for malignant neoplasm of breast: Secondary | ICD-10-CM

## 2020-02-13 ENCOUNTER — Other Ambulatory Visit: Payer: Self-pay

## 2020-02-13 ENCOUNTER — Emergency Department (HOSPITAL_COMMUNITY)
Admission: EM | Admit: 2020-02-13 | Discharge: 2020-02-14 | Disposition: A | Discharge status: Home / Self Care | Payer: BLUE CROSS/BLUE SHIELD | Attending: Emergency Medicine | Admitting: Emergency Medicine

## 2020-02-13 ENCOUNTER — Emergency Department (HOSPITAL_COMMUNITY): Payer: BLUE CROSS/BLUE SHIELD

## 2020-02-13 DIAGNOSIS — R0789 Other chest pain: Secondary | ICD-10-CM

## 2020-02-13 DIAGNOSIS — R072 Precordial pain: Secondary | ICD-10-CM | POA: Diagnosis not present

## 2020-02-13 LAB — CBC
HCT: 34.7 % — ABNORMAL LOW (ref 36.0–46.0)
Hemoglobin: 11.5 g/dL — ABNORMAL LOW (ref 12.0–15.0)
MCH: 33.1 pg (ref 26.0–34.0)
MCHC: 33.1 g/dL (ref 30.0–36.0)
MCV: 100 fL (ref 80.0–100.0)
Platelets: 305 10*3/uL (ref 150–400)
RBC: 3.47 MIL/uL — ABNORMAL LOW (ref 3.87–5.11)
RDW: 13.2 % (ref 11.5–15.5)
WBC: 10.1 10*3/uL (ref 4.0–10.5)
nRBC: 0 % (ref 0.0–0.2)

## 2020-02-13 LAB — BASIC METABOLIC PANEL
Anion gap: 8 (ref 5–15)
BUN: 14 mg/dL (ref 6–20)
CO2: 26 mmol/L (ref 22–32)
Calcium: 9.2 mg/dL (ref 8.9–10.3)
Chloride: 99 mmol/L (ref 98–111)
Creatinine, Ser: 0.85 mg/dL (ref 0.44–1.00)
GFR, Estimated: >60 mL/min (ref >60)
Glucose, Bld: 115 mg/dL — ABNORMAL HIGH (ref 70–99)
Potassium: 4.3 mmol/L (ref 3.5–5.1)
Sodium: 133 mmol/L — ABNORMAL LOW (ref 135–145)

## 2020-02-13 LAB — TROPONIN I (HIGH SENSITIVITY): Troponin I (High Sensitivity): 5 ng/L (ref <18)

## 2020-02-13 NOTE — ED Triage Notes (Signed)
Pt was at home tonight and about 7 pm she started having centralized chest pain. Pain doe snot radiate. No N/V . Pt said does  go though to her left shoulder blade. Pt has no SOB. She stated that when she takes a breath feels like something is catching. Pt said pain getting more and more intense over the last 2 hrs

## 2020-02-14 ENCOUNTER — Emergency Department (HOSPITAL_COMMUNITY): Payer: BLUE CROSS/BLUE SHIELD

## 2020-02-14 LAB — I-STAT BETA HCG BLOOD, ED (MC, WL, AP ONLY): I-stat hCG, quantitative: <5 m[IU]/mL (ref <5)

## 2020-02-14 LAB — TROPONIN I (HIGH SENSITIVITY): Troponin I (High Sensitivity): 4 ng/L (ref <18)

## 2020-02-14 MED ORDER — LIDOCAINE VISCOUS HCL 2 % MT SOLN
15.0000 mL | Freq: Once | OROMUCOSAL | Status: AC
  Administered 2020-02-14: 15 mL via ORAL
  Filled 2020-02-14: qty 15

## 2020-02-14 MED ORDER — IOHEXOL 350 MG/ML SOLN
100.0000 mL | Freq: Once | INTRAVENOUS | Status: AC | PRN
  Administered 2020-02-14: 100 mL via INTRAVENOUS

## 2020-02-14 MED ORDER — ALUM & MAG HYDROXIDE-SIMETH 200-200-20 MG/5ML PO SUSP
30.0000 mL | Freq: Once | ORAL | Status: AC
  Administered 2020-02-14: 30 mL via ORAL
  Filled 2020-02-14: qty 30

## 2020-02-14 NOTE — ED Notes (Signed)
Discharge instructions discussed with pt. Pt verbalized understanding. Pt stable and ambulatory. No signature pad available. 

## 2020-02-14 NOTE — ED Provider Notes (Addendum)
[facility] EMERGENCY DEPARTMENT Provider Note  CSN: [id number] Arrival date & time: 02/13/20 2227  Chief Complaint(s) Chest Pain  HPI Julie Stone is a 54 y.o. female   The history is provided by the patient.  Chest Pain Pain location:  Substernal area Pain quality: pressure   Pain radiates to:  Mid back Pain severity:  Moderate Onset quality:  Gradual Duration:  6 hours Timing:  Constant Progression:  Improving Chronicity:  New Relieved by:  Nothing Worsened by:  Nothing Associated symptoms: no cough, no diaphoresis, no fatigue, no fever, no lower extremity edema and no shortness of breath   Risk factors: no coronary artery disease, no diabetes mellitus, no high cholesterol, no hypertension and no smoking     Patient with a history of hypercoagulable state on Pradaxa.  Prior carotid dissection.  Past Medical History Past Medical History:  Diagnosis Date  . Carotid artery occlusion   . Depression   . GERD (gastroesophageal reflux disease)   . Heart murmur   . 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 Hx of blood clots    Pt had blood clot in right thumb  . Migraines    4/week - does have botox injections  . PONV (postoperative nausea and vomiting)    Patient Active Problem List   Diagnosis Date Noted  . Chronic migraine without aura without status migrainosus, not intractable 07/17/2016  . Occipital neuralgia 08/10/2015  . 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 Migraine with aura 05/24/2015  . Hypercoagulable state  05/24/2015  . Hyponatremia 05/17/2015  . Memory disturbance 04/27/2015  . 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 Spondylolisthesis of lumbar region 10/15/2013  . Low back pain 08/28/2013  . Spondylolysis of lumbosacral region 08/28/2013  . History of blood clots 08/19/2012  .  Occlusion and stenosis of carotid artery without mention of cerebral infarction 07/22/2012   Home Medication(s) Prior to Admission medications   Medication Sig Start Date End Date Taking? Authorizing Provider  buPROPion HCl ER, XL, 450 MG TB24 Take 450 mg by mouth every morning.  02/12/20  Yes [provider]  dabigatran (PRADAXA) 150 MG CAPS capsule Take 150 mg by mouth 2 (two) times daily.   Yes [provider]  donepezil (ARICEPT) 23 MG TABS tablet Take 23 mg by mouth daily.   Yes [provider]  Estradiol (DIVIGEL) 0.5 MG/0.5GM GEL Place 1 application onto the skin daily.   Yes [provider]  hydrocortisone (CORTEF) 10 MG tablet Take 15 mg by mouth daily.   Yes [provider]  ketorolac (TORADOL) 10 MG tablet Take 10 mg by mouth every 8 (eight) hours as needed for severe pain.  11/15/16  Yes [provider]  levothyroxine (SYNTHROID) 75 MCG tablet Take 75 mcg by mouth daily before breakfast.   Yes [provider]  memantine (NAMENDA) 10 MG tablet Take 15 mg by mouth daily.    Yes [provider]  montelukast (SINGULAIR) 10 MG tablet Take 10 mg by mouth at bedtime as needed (allergies).   Yes [provider]  naratriptan (AMERGE) 2.5 MG tablet Take 2.5 mg by mouth as needed for migraine. Take one (1) tablet at onset of headache; if returns or does not resolve, may repeat after 4 hours; do not exceed five (5) mg in 24 hours.   Yes [provider]  PRESCRIPTION MEDICATION Place  1 application onto the skin daily. Testosterone cream 0.25 mg   Yes [provider]  propranolol ER (INDERAL LA) 120 MG 24 hr capsule Take 120 mg by mouth daily.  08/28/16  Yes [provider]  sodium chloride 1 g tablet Take 8 tablets (8 g total) by mouth daily. Patient taking differently: Take 5 g by mouth daily.  05/27/18  Yes Romero Belling, MD  tiZANidine (ZANAFLEX) 4 MG tablet Take 4 mg by mouth every 8 (eight)  hours as needed for muscle spasms.  11/15/16  Yes [provider]  venlafaxine (EFFEXOR) 75 MG tablet Take 75 mg by mouth daily. 12/19/19  Yes [provider]  Venlafaxine HCl 225 MG TB24 Take 225 mg by mouth daily. 01/12/20  Yes [provider]                                                                                                                                    Past Surgical History Past Surgical History:  Procedure Laterality Date  . ABDOMINAL HYSTERECTOMY    . BACK SURGERY    . CESAREAN SECTION    . CYST EXCISION Right 10/03/2018   Procedure: CYST REMOVAL;  Surgeon: Betha Loa, MD;  Location: [facility];  Service: Orthopedics;  Laterality: Right;  . FOOT SURGERY Right   . I & D EXTREMITY Right 10/03/2018   Procedure: DEBRIDEMENT DISTAL INTERPHALANGEAL JOINT;  Surgeon: Betha Loa, MD;  Location: [facility];  Service: Orthopedics;  Laterality: Right;  . WRIST SURGERY Right    Family History Family History  Problem Relation Age of Onset  . Hypertension Mother   . Migraines Father   . Depression Father   . Breast cancer Other 20  . Other Neg Hx        hyponatremia    Social History Social History   Tobacco Use  . Smoking status: Never Smoker  . Smokeless tobacco: Never Used  Vaping Use  . Vaping Use: Never used  Substance Use Topics  . Alcohol use: Yes    Alcohol/week: 2.0 standard drinks    Types: 2 Glasses of wine per week  . Drug use: No   Allergies Morphine, Sulfa antibiotics, Sulfamethoxazole, Chlorhexidine gluconate, Darvon [propoxyphene hcl], Morphine and related, Triptans, Erenumab-aooe, and Propoxyphene  Review of Systems Review of Systems  Constitutional: Negative for diaphoresis, fatigue and fever.  Respiratory: Negative for cough and shortness of breath.   Cardiovascular: Positive for chest pain.   All other systems are reviewed and are negative for acute change except as noted in the  HPI  Physical Exam Vital Signs  I have reviewed the triage vital signs BP (!) 150/78 (BP Location: Right Arm)   Pulse 64   Temp 98 F (36.7 C) (Oral)   Resp 12   Ht  (1.575 m)   Wt 70.3 kg   SpO2 99%   BMI 28.35  kg/m   Physical Exam Vitals reviewed.  Constitutional:      General: She is not in acute distress.    Appearance: She is well-developed. She is not diaphoretic.  HENT:     Head: Normocephalic and atraumatic.     Nose: Nose normal.  Eyes:     General: No scleral icterus.       Right eye: No discharge.        Left eye: No discharge.     Conjunctiva/sclera: Conjunctivae normal.     Pupils: Pupils are equal, round, and reactive to light.  Cardiovascular:     Rate and Rhythm: Normal rate and regular rhythm.     Heart sounds: Murmur heard.  Systolic murmur is present with a grade of 3/6.  No friction rub. No gallop.   Pulmonary:     Effort: Pulmonary effort is normal. No respiratory distress.     Breath sounds: Normal breath sounds. No stridor. No rales.  Abdominal:     General: There is no distension.     Palpations: Abdomen is soft.     Tenderness: There is no abdominal tenderness.  Musculoskeletal:        General: No tenderness.     Cervical back: Normal range of motion and neck supple.     Right lower leg: No edema.     Left lower leg: No edema.  Skin:    General: Skin is warm and dry.     Findings: No erythema or rash.  Neurological:     Mental Status: She is alert and oriented to person, place, and time.     ED Results and Treatments Labs (all labs ordered are listed, but only abnormal results are displayed) Labs Reviewed  BASIC METABOLIC PANEL - Abnormal; Notable for the following components:      Result Value   Sodium 133 (*)    Glucose, Bld 115 (*)    All other components within normal limits  CBC - Abnormal; Notable for the following components:   RBC 3.47 (*)    Hemoglobin 11.5 (*)    HCT 34.7 (*)    All other components within  normal limits  I-STAT BETA HCG BLOOD, ED ([facility], [facility], [facility] ONLY)  TROPONIN I (HIGH SENSITIVITY)  TROPONIN I (HIGH SENSITIVITY)                                                                                                                         EKG  EKG Interpretation  Date/Time:  Friday February 13 2020 22:32:50 EDT Ventricular Rate:  79 PR Interval:  134 QRS Duration: 94 QT Interval:  380 QTC Calculation: 435 R Axis:   42 Text Interpretation: Normal sinus rhythm Normal ECG No acute changes Reconfirmed by Drema Pryardama, Zaviyar Rahal [phone]) on 02/14/2020 12:39:50 AM      Radiology DG Chest 2 View  Result Date: 02/13/2020 CLINICAL DATA:  Chest pain radiating to the left EXAM: CHEST - 2 VIEW COMPARISON:  04/05/2009 CT  of the chest FINDINGS: Cardiac shadow is within normal limits. The lungs are well aerated bilaterally. No focal infiltrate or effusion is seen. No pneumothorax is noted. No acute bony abnormality is seen. IMPRESSION: No active cardiopulmonary disease. Electronically Signed   By: Alcide Clever M.D.   On: 02/13/2020 23:27   CT Angio Chest/Abd/Pel for Dissection W and/or Wo Contrast  Result Date: 02/14/2020 CLINICAL DATA:  Chest pain EXAM: CT ANGIOGRAPHY CHEST, ABDOMEN AND PELVIS TECHNIQUE: Non-contrast CT of the chest was initially obtained. Multidetector CT imaging through the chest, abdomen and pelvis was performed using the standard protocol during bolus administration of intravenous contrast. Multiplanar reconstructed images and MIPs were obtained and reviewed to evaluate the vascular anatomy. CONTRAST:  OMNIPAQUE IOHEXOL 350 MG/ML SOLN COMPARISON:  None. FINDINGS: CTA CHEST FINDINGS Cardiovascular: --Heart: The heart size is mildly enlarged. There is nopericardial effusion. --Aorta: The course and caliber of the thoracic aorta are normal. There is minimal aortic atherosclerotic calcification. Precontrast images show no aortic intramural hematoma. There is no blood pool, dissection or  penetrating ulcer demonstrated on arterial phase postcontrast imaging. There is a conventional 3 vessel aortic arch branching pattern. The proximal arch vessels are widely patent. --Pulmonary Arteries: Contrast timing is optimized for preferential opacification of the aorta. Within that limitation, normal central pulmonary arteries. Mediastinum/Nodes: Small hiatal hernia. Lungs/Pleura: No pulmonary nodules or masses. No pleural effusion or pneumothorax. No focal airspace consolidation. No focal pleural abnormality. Musculoskeletal: No chest wall abnormality. No acute osseous findings. Review of the MIP images confirms the above findings. CTA ABDOMEN AND PELVIS FINDINGS VASCULAR Aorta: Normal caliber aorta without aneurysm, dissection, vasculitis or hemodynamically significant stenosis. There is mild aortic atherosclerosis. Celiac: No aneurysm, dissection or hemodynamically significant stenosis. Normal branching pattern. SMA: Widely patent without dissection or stenosis. Renals: Single renal arteries bilaterally. No aneurysm, dissection, stenosis or evidence of fibromuscular dysplasia. IMA: Patent without abnormality. Inflow: No aneurysm, stenosis or dissection. Veins: Normal course and caliber of the major veins. Assessment is otherwise limited by the arterial dominant contrast phase. Review of the MIP images confirms the above findings. NON-VASCULAR Hepatobiliary: Normal hepatic contours and density. No visible biliary dilatation. Normal gallbladder. Pancreas: Normal contours without ductal dilatation. No peripancreatic fluid collection. Spleen: Normal arterial phase splenic enhancement pattern. Adrenals/Urinary Tract: --Adrenal glands: Normal. --Right kidney/ureter: No hydronephrosis or perinephric stranding. No nephrolithiasis. No obstructing ureteral stones. --Left kidney/ureter: No hydronephrosis or perinephric stranding. No nephrolithiasis. No obstructing ureteral stones. --Urinary bladder: Distended  Stomach/Bowel: --Stomach/Duodenum: No hiatal hernia or other gastric abnormality. Normal duodenal course and caliber. --Small bowel: No dilatation or inflammation. --Colon: No focal abnormality. --Appendix: Normal. Lymphatic:  No abdominal or pelvic lymphadenopathy. Reproductive: Status post hysterectomy. No adnexal mass. Musculoskeletal. No bony spinal canal stenosis or focal osseous abnormality. Grade 1 anterolisthesis at L5-S1. L5-S1 PLIF. Other: None. Review of the MIP images confirms the above findings. IMPRESSION: 1. No acute aortic syndrome or other acute abnormality of the chest, abdomen or pelvis. Aortic Atherosclerosis (ICD10-I70.0). Electronically Signed   By: Deatra Robinson M.D.   On: 02/14/2020 02:47    Pertinent labs & imaging results that were available during my care of the patient were reviewed by me and considered in my medical decision making (see chart for details).  Medications Ordered in ED Medications  alum & mag hydroxide-simeth (MAALOX/MYLANTA) 200-200-20 MG/5ML suspension 30 mL (30 mLs Oral Given 02/14/20 0140)    And  lidocaine (XYLOCAINE) 2 % viscous mouth solution 15 mL (15 mLs Oral Given 02/14/20  0140)  iohexol (OMNIPAQUE) 350 MG/ML injection 100 mL (100 mLs Intravenous Contrast Given 02/14/20 0217)                                                                                                                                    Procedures Procedures  (including critical care time)  Medical Decision Making / ED Course I have reviewed the nursing notes for this encounter and the patient's prior records (if available in EHR or on provided paperwork).   Julie Stone was evaluated in Emergency Department on 02/14/2020 for the symptoms described in the history of present illness. She was evaluated in the context of the global COVID-19 pandemic, which necessitated consideration that the patient might be at risk for infection with the SARS-CoV-2 virus that causes  COVID-19. Institutional protocols and algorithms that pertain to the evaluation of patients at risk for COVID-19 are in a state of rapid change based on information released by regulatory bodies including the CDC and federal and state organizations. These policies and algorithms were followed during the patient's care in the ED.  Patient presents with atypical chest pain. EKG without acute ischemic changes or evidence of pericarditis. Pain currently mild per patient. Initial troponin negative. Heart score of 4. Will obtain a delta troponin and reassess.  Chest x-ray without evidence suggestive of pneumonia, pneumothorax, pneumomediastinum.  No abnormal contour of the mediastinum to suggest dissection. No evidence of acute injuries.  We will treat patient for possible GI etiology.   However given patient's history of prior carotid dissection with a presenting chest pain pattern that is typical for aortic dissection, will obtain a CTA.  This will help assess for dissection and possible PEs as well.  CTA negative for dissection and no obvious filling defects concerning for PE.  Second troponin flat and negative. Pain did not improve with GI cocktail.  Discussed options of admission for observation and continued troponin trending versus DC with close PCP/cardiology follow-up.  Patient chose to be discharged home with close follow-up.      Final Clinical Impression(s) / ED Diagnoses Final diagnoses:  Atypical chest pain   The patient appears reasonably screened and/or stabilized for discharge and I doubt any other medical condition or other [facility] requiring further screening, evaluation, or treatment in the ED at this time prior to discharge. Safe for discharge with strict return precautions.  Disposition: Discharge  Condition: Good  I have discussed the results, Dx and Tx plan with the patient/family who expressed understanding and agree(s) with the plan. Discharge instructions discussed  at length. The patient/family was given strict return precautions who verbalized understanding of the instructions. No further questions at time of discharge.    ED Discharge Orders         Ordered    Ambulatory referral to Cardiology        02/14/20 [phone]  Follow Up: Lupita Raider, MD [address] [place] [address] [phone]  Schedule an appointment as soon as possible for a visit    [facility] [address] [place] [address] [phone] Call  To schedule an appointment for close follow up      This chart was dictated using voice recognition software.  Despite best efforts to proofread,  errors can occur which can change the documentation meaning.     Nira Conn, MD 02/14/20 [phone]

## 2020-03-11 ENCOUNTER — Other Ambulatory Visit: Payer: Self-pay

## 2020-03-11 ENCOUNTER — Ambulatory Visit (INDEPENDENT_AMBULATORY_CARE_PROVIDER_SITE_OTHER): Payer: BLUE CROSS/BLUE SHIELD | Admitting: Internal Medicine

## 2020-03-11 ENCOUNTER — Encounter: Payer: Self-pay | Admitting: Internal Medicine

## 2020-03-11 VITALS — BP 130/74 | HR 70 | Ht 63.0 in | Wt 167.4 lb

## 2020-03-11 DIAGNOSIS — I209 Angina pectoris, unspecified: Secondary | ICD-10-CM | POA: Insufficient documentation

## 2020-03-11 DIAGNOSIS — I700 Atherosclerosis of aorta: Secondary | ICD-10-CM | POA: Diagnosis not present

## 2020-03-11 DIAGNOSIS — I773 Arterial fibromuscular dysplasia: Secondary | ICD-10-CM | POA: Diagnosis not present

## 2020-03-11 DIAGNOSIS — I208 Other forms of angina pectoris: Secondary | ICD-10-CM

## 2020-03-11 MED ORDER — METOPROLOL TARTRATE 50 MG PO TABS: ORAL_TABLET | ORAL | 0 refills | Status: DC

## 2020-03-11 NOTE — Progress Notes (Signed)
Cardiology Office Note:    Date:  03/11/2020   ID:  Julie Stone, DOB Oct 08, 1965, MRN [id number]  PCP:  Mayra Neer, MD  [facility] Cardiologist:  No primary care provider on file.  [facility] Electrophysiologist:  None   CC: chest pain Consulted for the evaluation of chest pain at the behest of Mayra Neer, MD  History of Present Illness:    Julie Stone is a 54 y.o. 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    Patient had recent CP eval 02/14/20. As part of the work up, normal novel troponin testing.  CT Aortic Dissection protocol notable for normal aorta and no coronary calcium.  Patient notes that she has sternal chest pressure.  02/14/20 happened with rest pain sternal chest pain without radiation. At baseline, walking up a mild has sternal chest pain that improves with rest.  Pain radiates at worst to the left shoulder. No syncope or syncope.  Past Medical History:  Diagnosis Date  . Carotid artery occlusion   . Depression   . GERD (gastroesophageal reflux disease)   . Heart murmur   . 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 Hx of blood clots    Pt had blood clot in right thumb  . Migraines    4/week - does have botox injections  . PONV (postoperative nausea and vomiting)     Past Surgical History:  Procedure Laterality Date  . ABDOMINAL HYSTERECTOMY    . BACK SURGERY    . CESAREAN SECTION    . CYST EXCISION Right 10/03/2018   Procedure: CYST REMOVAL;  Surgeon: Leanora Cover, MD;  Location: [place];  Service: Orthopedics;  Laterality: Right;  . FOOT SURGERY Right   . I & D EXTREMITY Right 10/03/2018   Procedure: DEBRIDEMENT DISTAL INTERPHALANGEAL JOINT;  Surgeon: Leanora Cover,  MD;  Location: [place];  Service: Orthopedics;  Laterality: Right;  . WRIST SURGERY Right     Current Medications: Current Meds  Medication Sig  . buPROPion HCl ER, XL, 450 MG TB24 Take 450 mg by mouth every morning.   . dabigatran (PRADAXA) 150 MG CAPS capsule Take 150 mg by mouth 2 (two) times daily.  Marland Kitchen donepezil (ARICEPT) 23 MG TABS tablet Take 23 mg by mouth daily.  . Estradiol (DIVIGEL) 0.5 WG/6.6ZL GEL Place 1 application onto the skin daily.  . hydrocortisone (CORTEF) 10 MG tablet Take 15 mg by mouth daily.  Marland Kitchen ketorolac (TORADOL) 10 MG tablet Take 10 mg by mouth every 8 (eight) hours as needed for severe pain.   Marland Kitchen LATUDA 20 MG TABS tablet Take 20 mg by mouth daily.  Marland Kitchen levothyroxine (SYNTHROID) 75 MCG tablet Take 75 mcg by mouth daily before breakfast.  . memantine (NAMENDA) 10 MG tablet Take 15 mg by mouth daily.   . naratriptan (AMERGE) 2.5 MG tablet Take 2.5 mg by mouth as needed for migraine. Take one (1) tablet at onset of headache; if returns or does not resolve, may repeat after 4 hours; do not exceed five (5) mg in 24 hours.  Marland Kitchen PRESCRIPTION MEDICATION Place 1 application onto the skin daily. Testosterone cream 0.25 mg  . propranolol ER (INDERAL LA) 120 MG 24 hr capsule Take 120 mg by mouth daily.   . sodium chloride 1  g tablet Take 8 tablets (8 g total) by mouth daily. (Patient taking differently: Take 5 g by mouth daily. )  . tiZANidine (ZANAFLEX) 4 MG tablet Take 4 mg by mouth every 8 (eight) hours as needed for muscle spasms.   Marland Kitchen venlafaxine (EFFEXOR) 75 MG tablet Take 75 mg by mouth daily.  . Venlafaxine HCl 225 MG TB24 Take 225 mg by mouth daily.     Allergies:   Morphine, Sulfa antibiotics, Sulfamethoxazole, Chlorhexidine gluconate, Darvon [propoxyphene hcl], Morphine and related, Triptans, Erenumab-aooe, and Propoxyphene   Social History   Socioeconomic History  . Marital status: Married    Spouse name: Not on file  . Number of children: Not on  file  . Years of education: Not on file  . Highest education level: Not on file  Occupational History  . Not on file  Tobacco Use  . Smoking status: Never Smoker  . Smokeless tobacco: Never Used  Vaping Use  . Vaping Use: Never used  Substance and Sexual Activity  . Alcohol use: Yes    Alcohol/week: 2.0 standard drinks    Types: 2 Glasses of wine per week  . Drug use: No  . Sexual activity: Not on file  Other Topics Concern  . Not on file  Social History Narrative   Married   2 daughters   Exercise: weight lifting 2x weekly; some cardio   Social Determinants of Health   Financial Resource Strain:   . Difficulty of Paying Living Expenses: Not on file  Food Insecurity:   . Worried About Charity fundraiser in the Last Year: Not on file  . Ran Out of Food in the Last Year: Not on file  Transportation Needs:   . Lack of Transportation (Medical): Not on file  . Lack of Transportation (Non-Medical): Not on file  Physical Activity:   . Days of Exercise per Week: Not on file  . Minutes of Exercise per Session: Not on file  Stress:   . Feeling of Stress : Not on file  Social Connections:   . Frequency of Communication with Friends and Family: Not on file  . Frequency of Social Gatherings with Friends and Family: Not on file  . Attends Religious Services: Not on file  . Active Member of Clubs or Organizations: Not on file  . Attends Archivist Meetings: Not on file  . Marital Status: Not on file    Family History: The patient's family history includes Breast cancer (age of onset: 69) in an other family member; Depression in her father; Hypertension in her mother; Migraines in her father. There is no history of Other. Mother had peripheral artery disease Father had MI in early 67s  ROS:   Please see the history of present illness.    All other systems reviewed and are negative.  EKGs/Labs/Other Studies Reviewed:    The following studies were reviewed  today:  EKG:   02/13/20 SR rate 79 no ST/T changes  Recent Labs: 02/13/2020: BUN 14; Creatinine, Ser 0.85; Hemoglobin 11.5; Platelets 305; Potassium 4.3; Sodium 133  Recent Lipid Panel    Component Value Date/Time   CHOL  06/07/2008 0345    133        ATP III CLASSIFICATION:  <200     mg/dL   Desirable  200-239  mg/dL   Borderline High  >=240    mg/dL   High          TRIG 37 06/07/2008 0345  HDL 38 (L) 06/07/2008 0345   CHOLHDL 3.5 06/07/2008 0345   VLDL 7 06/07/2008 0345   LDLCALC  06/07/2008 0345    88        Total Cholesterol/HDL:CHD Risk Coronary Heart Disease Risk Table                     Men   Women  1/2 Average Risk   3.4   3.3  Average Risk       5.0   4.4  2 X Average Risk   9.6   7.1  3 X Average Risk  23.4   11.0        Use the calculated Patient Ratio above and the CHD Risk Table to determine the patient's CHD Risk.        ATP III CLASSIFICATION (LDL):  <100     mg/dL   Optimal  100-129  mg/dL   Near or Above                    Optimal  130-159  mg/dL   Borderline  160-189  mg/dL   High  >190     mg/dL   Very High   Physical Exam:    VS:  BP 130/74   Pulse 70   Ht 5' 3"  (1.6 m)   Wt 167 lb 6.4 oz (75.9 kg)   SpO2 97%   BMI 29.65 kg/m     Wt Readings from Last 3 Encounters:  03/11/20 167 lb 6.4 oz (75.9 kg)  02/13/20 155 lb (70.3 kg)  10/03/18 155 lb 3.3 oz (70.4 kg)    GEN:  Well nourished, well developed in no acute distress HEENT: Normal NECK: No JVD; No carotid bruits LYMPHATICS: No lymphadenopathy CARDIAC: RRR, no murmurs, rubs, gallops RESPIRATORY:  Clear to auscultation without rales, wheezing or rhonchi  ABDOMEN: Soft, non-tender, non-distended MUSCULOSKELETAL:  No edema; No deformity  SKIN: Warm and dry NEUROLOGIC:  Alert and oriented x 3 PSYCHIATRIC:  Normal affect   ASSESSMENT:    1. Angina pectoris ([place])   2. Fibromuscular dysplasia of cervicocranial artery ([place])   3. Aortic atherosclerosis ([place])   4. Stable angina  pectoris ([place])    PLAN:    In order of problems listed above:  Angina Pectoris  Fibromuscular dysplasia L Carotid Aortic Atherosclerosis HLD - Will get CCTA +/- FFR to evaluation CP  - will try dietary Lipid changes and will check fasting lipids at next visit - continue [facility] for her non-cardiac indications - Discussed risk and benefits of future testing; at next visit will discuss Renal Artery Duplex  3 month follow up unless new symptoms or abnormal test results warranting change in plan  Would be reasonable for  Virtual Follow up  Would be reasonable for  APP Follow up   Shared Decision Making/Informed Consent     Medication Adjustments/Labs and Tests Ordered: Current medicines are reviewed at length with the patient today.  Concerns regarding medicines are outlined above.  Orders Placed This Encounter  Procedures  . CT CORONARY MORPH W/CTA COR W/SCORE W/CA W/CM &/OR WO/CM  . CT CORONARY FRACTIONAL FLOW RESERVE DATA PREP  . CT CORONARY FRACTIONAL FLOW RESERVE FLUID ANALYSIS   Meds ordered this encounter  Medications  . metoprolol tartrate (LOPRESSOR) 50 MG tablet    Sig: Take 1 tablet 2 hours prior to your CT scan    Dispense:  1 tablet    Refill:  0  Patient Instructions  Medication Instructions:  Your physician recommends that you continue on your current medications as directed. Please refer to the Current Medication list given to you today.  *If you need a refill on your cardiac medications before your next appointment, please call your pharmacy*  Lab Work: None ordered today  Testing/Procedures: Your cardiac CT will be scheduled at one of the below locations:   [facility] [address] [place], [place] [address] [phone]  [place] [address] [place], [place] [address] [phone]  If scheduled at [facility], please arrive at the [facility] main entrance of [facility] 30 minutes prior to test start time. Proceed to the [facility] Radiology Department (first floor) to check-in and test prep.  If scheduled at [facility], please arrive 15 mins early for check-in and test prep.  Please follow these instructions carefully (unless otherwise directed):  On the Night Before the Test: . Be sure to Drink plenty of water. . Do not consume any caffeinated/decaffeinated beverages or chocolate 12 hours prior to your test. . Do not take any antihistamines 12 hours prior to your test.  On the Day of the Test: . Drink plenty of water. Do not drink any water within one hour of the test. . Do not eat any food 4 hours prior to the test. . You may take your regular medications prior to the test.  . Take metoprolol (Lopressor) 50 mg two hours prior to test. . HOLD Propranolol the night before the test. . FEMALES- please wear underwire-free bra if available  After the Test: . Drink plenty of water. . After receiving IV contrast, you may experience a mild flushed feeling. This is normal. . On occasion, you may experience a mild rash up to 24 hours after the test. This is not dangerous. If this occurs, you can take Benadryl 25 mg and increase your fluid intake. . If you experience trouble breathing, this can be serious. If it is severe call 911 IMMEDIATELY. If it is mild, please call our office. . If you take any of these medications: Glipizide/Metformin, Avandament, Glucavance, please do not take 48 hours after completing test unless otherwise instructed.  Once we have confirmed authorization from your insurance company, we will call you to set up a date and time for your test. Based on how quickly your insurance processes prior authorizations requests, please allow up to 4 weeks to be contacted for scheduling your Cardiac CT appointment. Be advised that routine Cardiac CT appointments could be scheduled as many as 8 weeks after your  provider has ordered it.  For non-scheduling related questions, please contact the cardiac imaging nurse navigator should you have any questions/concerns: Marchia Bond, Cardiac Imaging Nurse Navigator Burley Saver, Interim Cardiac Imaging Nurse [place] [facility] Direct Office Dial: [phone]   For scheduling needs, including cancellations and rescheduling, please call [place], [phone] (temporary number).   Follow-Up: At [facility], you and your health needs are our priority.  As part of our continuing mission to provide you with exceptional heart care, we have created designated Provider Care Teams.  These Care Teams include your primary Cardiologist (physician) and Advanced Practice Providers (APPs -  Physician Assistants and Nurse Practitioners) who all work together to provide you with the care you need, when you need it.  Your next appointment:   3 month(s) with fasting labs  The  format for your next appointment:   In Person  Provider:   Rudean Haskell, MD     Signed, Werner Lean, MD  03/11/2020 2:53 PM    [place]

## 2020-03-11 NOTE — Patient Instructions (Addendum)
Medication Instructions:  Your physician recommends that you continue on your current medications as directed. Please refer to the Current Medication list given to you today.  *If you need a refill on your cardiac medications before your next appointment, please call your pharmacy*  Lab Work: None ordered today  Testing/Procedures: Your cardiac CT will be scheduled at one of the below locations:   [facility] [address] [place], [place] [address] [phone]  [place] [address] [place], [place] [address] [phone]  If scheduled at [facility], please arrive at the [facility] main entrance of [facility] 30 minutes prior to test start time. Proceed to the [facility] Radiology Department (first floor) to check-in and test prep.  If scheduled at [facility], please arrive 15 mins early for check-in and test prep.  Please follow these instructions carefully (unless otherwise directed):  On the Night Before the Test: . Be sure to Drink plenty of water. . Do not consume any caffeinated/decaffeinated beverages or chocolate 12 hours prior to your test. . Do not take any antihistamines 12 hours prior to your test.  On the Day of the Test: . Drink plenty of water. Do not drink any water within one hour of the test. . Do not eat any food 4 hours prior to the test. . You may take your regular medications prior to the test.  . Take metoprolol (Lopressor) 50 mg two hours prior to test. . HOLD Propranolol the night before the test. . FEMALES- please wear underwire-free bra if available  After the Test: . Drink plenty of water. . After receiving IV contrast, you may experience a mild flushed feeling. This is normal. . On occasion, you may experience a mild rash up to 24 hours after the test. This is not dangerous. If this occurs, you can take Benadryl 25 mg and  increase your fluid intake. . If you experience trouble breathing, this can be serious. If it is severe call 911 IMMEDIATELY. If it is mild, please call our office. . If you take any of these medications: Glipizide/Metformin, Avandament, Glucavance, please do not take 48 hours after completing test unless otherwise instructed.  Once we have confirmed authorization from your insurance company, we will call you to set up a date and time for your test. Based on how quickly your insurance processes prior authorizations requests, please allow up to 4 weeks to be contacted for scheduling your Cardiac CT appointment. Be advised that routine Cardiac CT appointments could be scheduled as many as 8 weeks after your provider has ordered it.  For non-scheduling related questions, please contact the cardiac imaging nurse navigator should you have any questions/concerns: Marchia Bond, Cardiac Imaging Nurse Navigator Burley Saver, Interim Cardiac Imaging Nurse [place] [facility] Direct Office Dial: [phone]   For scheduling needs, including cancellations and rescheduling, please call [place], [phone] (temporary number).   Follow-Up: At [facility], you and your health needs are our priority.  As part of our continuing mission to provide you with exceptional heart care, we have created designated Provider Care Teams.  These Care Teams include your primary Cardiologist (physician) and Advanced Practice Providers (APPs -  Physician Assistants and Nurse Practitioners) who all work together to provide you with the care you need, when you need it.  Your next appointment:   3 month(s) with fasting labs  The format for your next  appointment:   In Person  Provider:   Rudean Haskell, MD

## 2020-03-15 ENCOUNTER — Other Ambulatory Visit: Payer: Self-pay

## 2020-03-15 ENCOUNTER — Ambulatory Visit
Admission: RE | Admit: 2020-03-15 | Discharge: 2020-03-15 | Disposition: A | Discharge status: Home / Self Care | Payer: BLUE CROSS/BLUE SHIELD | Source: Ambulatory Visit | Attending: Family Medicine | Admitting: Family Medicine

## 2020-03-15 DIAGNOSIS — Z1231 Encounter for screening mammogram for malignant neoplasm of breast: Secondary | ICD-10-CM

## 2020-03-23 ENCOUNTER — Telehealth (HOSPITAL_COMMUNITY): Payer: Self-pay | Admitting: *Deleted

## 2020-03-23 NOTE — Telephone Encounter (Signed)
Reaching out to patient to offer assistance regarding upcoming cardiac imaging study; pt verbalizes understanding of appt date/time, parking situation and where to check in, pre-test NPO status and medications ordered, and verified current allergies; name and call back number provided for further questions should they arise ° °Jakie Debow Tai RN Navigator Cardiac Imaging °[facility] °[phone] office °[phone] cell ° °

## 2020-03-25 ENCOUNTER — Ambulatory Visit (HOSPITAL_COMMUNITY)
Admission: RE | Admit: 2020-03-25 | Discharge: 2020-03-25 | Disposition: A | Discharge status: Home / Self Care | Payer: BLUE CROSS/BLUE SHIELD | Source: Ambulatory Visit | Attending: Internal Medicine | Admitting: Internal Medicine

## 2020-03-25 ENCOUNTER — Other Ambulatory Visit: Payer: Self-pay

## 2020-03-25 DIAGNOSIS — I209 Angina pectoris, unspecified: Secondary | ICD-10-CM | POA: Diagnosis present

## 2020-03-25 MED ORDER — IOHEXOL 350 MG/ML SOLN
80.0000 mL | Freq: Once | INTRAVENOUS | Status: AC | PRN
  Administered 2020-03-25: 80 mL via INTRAVENOUS

## 2020-03-25 MED ORDER — NITROGLYCERIN 0.4 MG SL SUBL
0.8000 mg | SUBLINGUAL_TABLET | Freq: Once | SUBLINGUAL | Status: AC
  Administered 2020-03-25: 0.8 mg via SUBLINGUAL

## 2020-03-25 MED ORDER — NITROGLYCERIN 0.4 MG SL SUBL
SUBLINGUAL_TABLET | SUBLINGUAL | Status: AC
  Filled 2020-03-25: qty 2

## 2020-03-26 ENCOUNTER — Telehealth: Payer: Self-pay | Admitting: *Deleted

## 2020-03-26 DIAGNOSIS — R011 Cardiac murmur, unspecified: Secondary | ICD-10-CM

## 2020-03-26 NOTE — Telephone Encounter (Signed)
-----   Message from Christell Constant, MD sent at 03/25/2020  5:57 PM EST ----- Results: No evidence of significant coronary artery disease- we will not have to stop her migraine medicine Plan: Will get echocardiogram given her heart murmur and that her left atrium is mildly dilated  Christell Constant, MD

## 2020-04-01 ENCOUNTER — Other Ambulatory Visit: Payer: Self-pay

## 2020-04-01 ENCOUNTER — Ambulatory Visit (HOSPITAL_COMMUNITY)
Admission: RE | Admit: 2020-04-01 | Discharge: 2020-04-01 | Disposition: A | Discharge status: Home / Self Care | Payer: BLUE CROSS/BLUE SHIELD | Source: Ambulatory Visit | Attending: Internal Medicine | Admitting: Internal Medicine

## 2020-04-01 DIAGNOSIS — I773 Arterial fibromuscular dysplasia: Secondary | ICD-10-CM | POA: Insufficient documentation

## 2020-04-01 DIAGNOSIS — I700 Atherosclerosis of aorta: Secondary | ICD-10-CM | POA: Diagnosis not present

## 2020-04-01 DIAGNOSIS — R011 Cardiac murmur, unspecified: Secondary | ICD-10-CM | POA: Diagnosis present

## 2020-04-01 LAB — ECHOCARDIOGRAM COMPLETE
Area-P 1/2: 3.89 cm2
S' Lateral: 2.9 cm

## 2020-04-01 NOTE — Progress Notes (Signed)
  Echocardiogram 2D Echocardiogram with 3D has been performed.  Leta Jungling M 04/01/2020, 1:46 PM

## 2020-04-02 ENCOUNTER — Telehealth: Payer: Self-pay

## 2020-04-02 NOTE — Telephone Encounter (Signed)
-----   Message from Christell Constant, MD sent at 04/02/2020 11:44 AM EST ----- Results: There is some evidence of some increased stiffening in your heart, which cause your left atrium to be a bit larger thank I expected on the CT scan. Plan: We will work hard to make sure your blood pressure is well controlled and look out for shortness of breath to make sure we keep this well controlled  Christell Constant, MD

## 2020-04-02 NOTE — Telephone Encounter (Signed)
Called the patient and left her a message to return a call to the office regarding her Echocardiogram results.

## 2020-04-03 ENCOUNTER — Encounter (HOSPITAL_COMMUNITY): Payer: Self-pay | Admitting: Emergency Medicine

## 2020-04-03 ENCOUNTER — Emergency Department (HOSPITAL_COMMUNITY)
Admission: EM | Admit: 2020-04-03 | Discharge: 2020-04-04 | Disposition: A | Discharge status: Home / Self Care | Payer: BLUE CROSS/BLUE SHIELD | Attending: Emergency Medicine | Admitting: Emergency Medicine

## 2020-04-03 ENCOUNTER — Other Ambulatory Visit: Payer: Self-pay

## 2020-04-03 DIAGNOSIS — Z79899 Other long term (current) drug therapy: Secondary | ICD-10-CM | POA: Diagnosis not present

## 2020-04-03 DIAGNOSIS — R519 Headache, unspecified: Secondary | ICD-10-CM | POA: Insufficient documentation

## 2020-04-03 DIAGNOSIS — M5440 Lumbago with sciatica, unspecified side: Secondary | ICD-10-CM | POA: Insufficient documentation

## 2020-04-03 DIAGNOSIS — M62830 Muscle spasm of back: Secondary | ICD-10-CM

## 2020-04-03 DIAGNOSIS — M545 Low back pain, unspecified: Secondary | ICD-10-CM | POA: Diagnosis present

## 2020-04-03 MED ORDER — KETOROLAC TROMETHAMINE 15 MG/ML IJ SOLN
15.0000 mg | Freq: Once | INTRAMUSCULAR | Status: AC
  Administered 2020-04-03: 23:00:00 15 mg via INTRAVENOUS
  Filled 2020-04-03: qty 1

## 2020-04-03 MED ORDER — DIAZEPAM 5 MG/ML IJ SOLN
2.5000 mg | Freq: Once | INTRAMUSCULAR | Status: AC
  Administered 2020-04-03: 23:00:00 2.5 mg via INTRAVENOUS
  Filled 2020-04-03: qty 2

## 2020-04-03 MED ORDER — ACETAMINOPHEN 500 MG PO TABS
1000.0000 mg | ORAL_TABLET | Freq: Once | ORAL | Status: AC
  Administered 2020-04-03: 1000 mg via ORAL
  Filled 2020-04-03: qty 2

## 2020-04-03 MED ORDER — LIDOCAINE 5 % EX PTCH
1.0000 | MEDICATED_PATCH | CUTANEOUS | Status: DC
  Administered 2020-04-03: 23:00:00 1 via TRANSDERMAL
  Filled 2020-04-03: qty 1

## 2020-04-03 MED ORDER — LACTATED RINGERS IV BOLUS
1000.0000 mL | Freq: Once | INTRAVENOUS | Status: AC
  Administered 2020-04-03: 23:00:00 1000 mL via INTRAVENOUS

## 2020-04-03 NOTE — ED Triage Notes (Addendum)
Pt to ED via [facility] with c/o lower back pain   St's she was standing and had sudden sharp pain in lower back  Pt has hx of back surg.  EMS gave pt Fentanyl IV

## 2020-04-03 NOTE — ED Provider Notes (Signed)
[facility] Provider [facility]   CSN: [id number] Arrival date & time: 04/03/20  1951     History Chief Complaint  Patient presents with  . Back Pain    Julie Stone is a 54 y.o. female presents with acute onset lower back pain that started when she was bending over about 3 hours ago.  She states that she was stretching and getting ready to exercise when she bent and fell acute onset right lower back pain that radiates upper back and down the right leg.  No associated numbness, tingling, weakness.  Patient was unable to stand and states that the pain was so severe that she feels like she almost passed out.  No LOC.  No urine retention, incontinence, numbness, tingling, or weakness.  She states that she tried heat and muscle accidents but these did not provide any significant relief.  No previous similar symptoms.  The history is provided by the patient.  Back Pain Location:  Lumbar spine Quality:  Shooting and stabbing Radiates to: right leg and right back up to her neck. Pain severity:  Severe Onset quality:  Sudden Duration:  3 hours Timing:  Constant Progression:  Unchanged Chronicity:  New Context comment:  Bending over Relieved by:  Being still Worsened by:  Bending, movement, sitting and twisting Ineffective treatments:  Heating pad and muscle relaxants Associated symptoms: headaches (Other chronic migraine, similar to prior, no acute changes)   Associated symptoms: no abdominal pain, no bladder incontinence, no bowel incontinence, no chest pain, no dysuria, no fever, no numbness, no paresthesias, no pelvic pain, no perianal numbness, no tingling and no weakness        Past Medical History:  Diagnosis Date  . Carotid artery occlusion   . Depression   . GERD (gastroesophageal reflux disease)   . Heart murmur   . 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 Hx of blood clots     Pt had blood clot in right thumb  . Migraines    4/week - does have botox injections  . PONV (postoperative nausea and vomiting)     Patient Active Problem List   Diagnosis Date Noted  . Angina pectoris (HCC) 03/11/2020  . Aortic atherosclerosis (HCC) 03/11/2020  . Chronic migraine without aura without status migrainosus, not intractable 07/17/2016  . Occipital neuralgia 08/10/2015  . 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 Migraine with aura 05/24/2015  . Hypercoagulable state  05/24/2015  . Hyponatremia 05/17/2015  . Memory disturbance 04/27/2015  . 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 Spondylolisthesis of lumbar region 10/15/2013  . Low back pain 08/28/2013  . Spondylolysis of lumbosacral region 08/28/2013  . History of blood clots 08/19/2012  . Occlusion and stenosis of carotid artery without mention of cerebral infarction 07/22/2012    Past Surgical History:  Procedure Laterality Date  . ABDOMINAL HYSTERECTOMY    . BACK SURGERY    . CESAREAN SECTION    . CYST EXCISION Right 10/03/2018   Procedure: CYST REMOVAL;  Surgeon: Betha LoaKuzma, Kevin, MD;  Location: [facility];  Service: Orthopedics;  Laterality: Right;  . FOOT SURGERY Right   . I & D EXTREMITY Right 10/03/2018   Procedure: DEBRIDEMENT DISTAL INTERPHALANGEAL JOINT;  Surgeon: Betha LoaKuzma, Kevin, MD;  Location: [facility];  Service: Orthopedics;  Laterality: Right;  . WRIST SURGERY Right      OB  History   No obstetric history on file.     Family History  Problem Relation Age of Onset  . Hypertension Mother   . Migraines Father   . Depression Father   . Breast cancer Other 20  . Other Neg Hx        hyponatremia    Social History   Tobacco Use  . Smoking status: Never Smoker  . Smokeless tobacco: Never Used  Vaping Use  . Vaping Use: Never used  Substance Use Topics  . Alcohol use: Yes     Alcohol/week: 2.0 standard drinks    Types: 2 Glasses of wine per week  . Drug use: No    Home Medications Prior to Admission medications   Medication Sig Start Date End Date Taking? Authorizing Provider  buPROPion HCl ER, XL, 450 MG TB24 Take 450 mg by mouth every morning.  02/12/20   [provider]  dabigatran (PRADAXA) 150 MG CAPS capsule Take 150 mg by mouth 2 (two) times daily.    [provider]  donepezil (ARICEPT) 23 MG TABS tablet Take 23 mg by mouth daily.    [provider]  Estradiol (DIVIGEL) 0.5 MG/0.5GM GEL Place 1 application onto the skin daily.    [provider]  hydrocortisone (CORTEF) 10 MG tablet Take 15 mg by mouth daily.    [provider]  ketorolac (TORADOL) 10 MG tablet Take 10 mg by mouth every 8 (eight) hours as needed for severe pain.  11/15/16   [provider]  LATUDA 20 MG TABS tablet Take 20 mg by mouth daily. 02/20/20   [provider]  levothyroxine (SYNTHROID) 75 MCG tablet Take 75 mcg by mouth daily before breakfast.    [provider]  memantine (NAMENDA) 10 MG tablet Take 15 mg by mouth daily.     [provider]  metoprolol tartrate (LOPRESSOR) 50 MG tablet Take 1 tablet 2 hours prior to your CT scan 03/11/20   Riley Lam A, MD  naratriptan (AMERGE) 2.5 MG tablet Take 2.5 mg by mouth as needed for migraine. Take one (1) tablet at onset of headache; if returns or does not resolve, may repeat after 4 hours; do not exceed five (5) mg in 24 hours.    [provider]  PRESCRIPTION MEDICATION Place 1 application onto the skin daily. Testosterone cream 0.25 mg    [provider]  propranolol ER (INDERAL LA) 120 MG 24 hr capsule Take 120 mg by mouth daily.  08/28/16   [provider]  sodium chloride 1 g tablet Take 8 tablets (8 g total) by mouth daily. Patient taking differently: Take 5 g by mouth daily.  05/27/18   Romero Belling, MD  tiZANidine  (ZANAFLEX) 4 MG tablet Take 4 mg by mouth every 8 (eight) hours as needed for muscle spasms.  11/15/16   [provider]  venlafaxine (EFFEXOR) 75 MG tablet Take 75 mg by mouth daily. 12/19/19   [provider]  Venlafaxine HCl 225 MG TB24 Take 225 mg by mouth daily. 01/12/20   [provider]    Allergies    Morphine, Sulfa antibiotics, Sulfamethoxazole, Chlorhexidine gluconate, Darvon [propoxyphene hcl], Morphine and related, Triptans, Erenumab-aooe, and Propoxyphene  Review of Systems   Review of Systems  Constitutional: Negative for chills and fever.  HENT: Negative for ear pain and sore throat.   Eyes: Negative for pain and visual disturbance.  Respiratory: Negative for cough and shortness of breath.   Cardiovascular:  Negative for chest pain and palpitations.  Gastrointestinal: Negative for abdominal pain, bowel incontinence and vomiting.  Genitourinary: Negative for bladder incontinence, dysuria, hematuria and pelvic pain.  Musculoskeletal: Positive for back pain. Negative for arthralgias and neck stiffness.  Skin: Negative for color change and rash.  Neurological: Positive for headaches (Other chronic migraine, similar to prior, no acute changes). Negative for tingling, seizures, syncope, weakness, numbness and paresthesias.  All other systems reviewed and are negative.   Physical Exam Updated Vital Signs BP 119/71 (BP Location: Right Arm)   Pulse (!) 58   Temp (!) 97.5 F (36.4 C) (Oral)   Resp 18   Ht 5\' 3"  (1.6 m)   Wt 72.6 kg   SpO2 98%   BMI 28.34 kg/m   Physical Exam Vitals and nursing note reviewed.  Constitutional:      General: She is not in acute distress.    Appearance: She is well-developed and well-nourished.  HENT:     Head: Normocephalic and atraumatic.     Right Ear: External ear normal.     Left Ear: External ear normal.     Nose: Nose normal. No rhinorrhea.  Eyes:     General: No scleral icterus.    Extraocular  Movements: Extraocular movements intact.     Conjunctiva/sclera: Conjunctivae normal.     Pupils: Pupils are equal, round, and reactive to light.  Cardiovascular:     Rate and Rhythm: Normal rate and regular rhythm.     Heart sounds: No murmur heard.   Pulmonary:     Effort: Pulmonary effort is normal. No respiratory distress.     Breath sounds: Normal breath sounds.  Abdominal:     Palpations: Abdomen is soft.     Tenderness: There is no abdominal tenderness.  Musculoskeletal:        General: No edema.     Cervical back: Normal and neck supple.     Thoracic back: Normal.     Lumbar back: Spasms, tenderness and bony tenderness present.     Comments: Tenderness most severe over the right upper gluteal area.  Patient had significant pain while moving/turning and seemed to improve while she was still.  2+ PT pulses bilaterally, normal sensation in distal lower extremities, normal strength distally.  No saddle anesthesia.  Skin:    General: Skin is warm and dry.  Neurological:     Mental Status: She is alert and oriented to person, place, and time. Mental status is at baseline.     Cranial Nerves: No cranial nerve deficit.     Sensory: No sensory deficit.     Motor: No weakness.  Psychiatric:        Mood and Affect: Mood and affect normal.     ED Results / Procedures / Treatments   Labs (all labs ordered are listed, but only abnormal results are displayed) Labs Reviewed - No data to display  EKG None  Radiology No results found.  Procedures Procedures (including critical care time)  Medications Ordered in ED Medications  lidocaine (LIDODERM) 5 % 1 patch (1 patch Transdermal Patch Applied 04/03/20 2304)  cyclobenzaprine (FLEXERIL) tablet 10 mg (has no administration in time range)  oxyCODONE (Oxy IR/ROXICODONE) immediate release tablet 5 mg (has no administration in time range)  ketorolac (TORADOL) 15 MG/ML injection 15 mg (15 mg Intravenous Given 04/03/20 2303)   lactated ringers bolus 1,000 mL (1,000 mLs Intravenous New Bag/Given 04/03/20 2309)  diazepam (VALIUM) injection 2.5 mg (2.5 mg Intravenous Given 04/03/20  2303)  acetaminophen (TYLENOL) tablet 1,000 mg (1,000 mg Oral Given 04/03/20 2355)    ED Course  I have reviewed the triage vital signs and the nursing notes.  Pertinent labs & imaging results that were available during my care of the patient were reviewed by me and considered in my medical decision making (see chart for details).    MDM Rules/Calculators/A&P                          MDM: Glady Ouderkirk is a 55 y.o. female who presents with low back pain as per above. I have reviewed the nursing documentation for past medical history, family history, and social history. Pertinent previous records reviewed. She is awake, alert. HDS. Afebrile. Physical exam is most notable for lower right lumbar tenderness.  Tx: 15 mg IV Toradol, 2.5 mg IV Valium, 1 L LR, Lidoderm patch. 5mg  roxi and 10mg  flexeril ordered.  Differential Dx: I am most concerned for muscle spasm related to musculoskeletal strain of her lumbar spine.  Given history, physical exam, and work-up, I do not think she has cauda equina syndrome, epidural abscess, sciatica, fracture, malalignment, nerve impingement, or trauma.  MDM: JUSTICE AGUIRRE is a 54 y.o. female presents with acute onset back pain after bending over.  No obvious trauma concerning for fracture or malalignment.  History not consistent with cauda equina syndrome or possible epidural abscess.  Low concern for spinal stenosis.  Based on exam, feel that this patient's pain is more consistent with muscle spasm and have a low concern for emergent etiology of the patient's low back pain.  Will treat with medications as long as patient able ambulate, will plan to discharge home.  Patient reassessed and was able to ambulate slowly to bathroom.  Reported mild improvement in her pain.  Patient reassessed at 1:20 AM and  reported improvement in her pain but stated that it was still severe and that she did not tolerate her pain was well controlled at this time.  Discussed options for management including muscle relaxants, Tylenol/Advil, and conservative management at home with massage, stretching, and OTC medications.  Patient states that she has Zanaflex at home but does not feel that this was strong enough, has tried Flexeril before which has been successful.  Discussed with patient that narcotics were not indicated for outpatient management of lower back pain but that we could give a one-time dose here in the ED.  Also discussed giving Flexeril.  Patient amenable to trying Flexeril and reassessing.  History and exam not concerning for emergent etiology requiring ED imaging so we will not order at this time.  However, patient continues to have pain, may require imaging and possible admission. Transfer of care at 0100 to Dr. [name]. Assessment and plan of care communicated. Patient in stable condition at time of transfer.  5 mg oxycodone and 10 mg Flexeril ordered.  The plan for this patient was discussed with Dr. 59, who voiced agreement and who oversaw evaluation and treatment of this patient.   Final Clinical Impression(s) / ED Diagnoses Final diagnoses:  None    Rx / DC Orders ED Discharge Orders    None       Amal Renbarger, MD 04/04/20 0127    Criss Alvine, MD 04/04/20 1316

## 2020-04-04 MED ORDER — LIDOCAINE 5 % EX PTCH: 1.0000 | MEDICATED_PATCH | CUTANEOUS | 0 refills | Status: DC

## 2020-04-04 MED ORDER — OXYCODONE HCL 5 MG PO TABS: 5.0000 mg | ORAL_TABLET | ORAL | 0 refills | Status: DC | PRN

## 2020-04-04 MED ORDER — CYCLOBENZAPRINE HCL 10 MG PO TABS: 10.0000 mg | ORAL_TABLET | Freq: Two times a day (BID) | ORAL | 0 refills | Status: DC | PRN

## 2020-04-04 MED ORDER — CYCLOBENZAPRINE HCL 10 MG PO TABS: 10.0000 mg | ORAL_TABLET | Freq: Two times a day (BID) | ORAL | 0 refills | Status: AC | PRN

## 2020-04-04 MED ORDER — OXYCODONE HCL 5 MG PO TABS
5.0000 mg | ORAL_TABLET | Freq: Once | ORAL | Status: AC
  Administered 2020-04-04: 02:00:00 5 mg via ORAL
  Filled 2020-04-04: qty 1

## 2020-04-04 MED ORDER — CYCLOBENZAPRINE HCL 10 MG PO TABS
10.0000 mg | ORAL_TABLET | Freq: Once | ORAL | Status: AC
  Administered 2020-04-04: 02:00:00 10 mg via ORAL
  Filled 2020-04-04: qty 1

## 2020-04-04 NOTE — ED Provider Notes (Signed)
2:29 AM Care assumed from Dr. Curlene Labrum.  At time of transfer care, patient has received some pain medicine and will reassess for likely muscle spasms and back pain related to bending over today.  On reassessment, she started to feel better.  She still did not complain of any neurologic deficits or concerning symptoms of cauda equina or cord compression.  Patient is planning to call her back doctor on Monday to discuss further management.  Previous team did not feel she needed imaging and given patient's improving symptoms with medications, I agree at this time.  Patient reports that the combination of medicines has helped tonight and she would like a short prescription for pain medicine, muscle relaxant, and Lidoderm patches.  Given her improved symptoms and otherwise well appearance, we will provide her a short prescription for these.  She understands return precautions and follow-up instructions and was discharged in good condition.   Clinical Impression: 1. Muscle spasm of back   2. Acute low back pain with sciatica, sciatica laterality unspecified, unspecified back pain laterality     Disposition: Discharge  Condition: Good  I have discussed the results, Dx and Tx plan with the pt(& family if present). He/she/they expressed understanding and agree(s) with the plan. Discharge instructions discussed at great length. Strict return precautions discussed and pt &/or family have verbalized understanding of the instructions. No further questions at time of discharge.    New Prescriptions   CYCLOBENZAPRINE (FLEXERIL) 10 MG TABLET    Take 1 tablet (10 mg total) by mouth 2 (two) times daily as needed for muscle spasms.   LIDOCAINE (LIDODERM) 5 %    Place 1 patch onto the skin daily. Remove & Discard patch within 12 hours or as directed by MD   OXYCODONE (ROXICODONE) 5 MG IMMEDIATE RELEASE TABLET    Take 1 tablet (5 mg total) by mouth every 4 (four) hours as needed for severe pain.    Follow  Up: Lupita Raider, MD [address] [place] [address] [phone]     Your back doctor     [facility] EMERGENCY DEPARTMENT [address] [id number] mc [place] [address] [phone]           Caytlyn Evers, Canary Brim, MD 04/04/20 [phone]

## 2020-04-04 NOTE — Discharge Instructions (Signed)
Please follow-up with your back doctor to discuss further management of the back pain with muscle spasms.  Your history and exam today were reassuring and the team did not suspect acute cord injury or compression.  Please follow-up as we discussed.  Please the medications help with discomfort.  If any symptoms change or worsen, please return to the nearest emergency department.

## 2020-04-05 ENCOUNTER — Telehealth: Payer: Self-pay

## 2020-04-05 NOTE — Telephone Encounter (Signed)
The patient has been notified of the result and verbalized understanding.  All questions (if any) were answered. Leanord Hawking, RN 04/05/2020 10:19 AM

## 2020-04-05 NOTE — Telephone Encounter (Signed)
-----   Message from Mahesh A Chandrasekhar, MD sent at 04/02/2020 11:44 AM EST ----- Results: There is some evidence of some increased stiffening in your heart, which cause your left atrium to be a bit larger thank I expected on the CT scan. Plan: We will work hard to make sure your blood pressure is well controlled and look out for shortness of breath to make sure we keep this well controlled  Mahesh A Chandrasekhar, MD  

## 2020-06-15 ENCOUNTER — Ambulatory Visit: Payer: BLUE CROSS/BLUE SHIELD | Admitting: Internal Medicine

## 2020-06-16 ENCOUNTER — Other Ambulatory Visit: Payer: Self-pay

## 2020-06-16 ENCOUNTER — Ambulatory Visit: Payer: BLUE CROSS/BLUE SHIELD | Admitting: Internal Medicine

## 2020-06-16 ENCOUNTER — Encounter: Payer: Self-pay | Admitting: Internal Medicine

## 2020-06-16 VITALS — BP 124/78 | HR 52 | Ht 63.0 in | Wt 163.8 lb

## 2020-06-16 DIAGNOSIS — I773 Arterial fibromuscular dysplasia: Secondary | ICD-10-CM | POA: Diagnosis not present

## 2020-06-16 DIAGNOSIS — I700 Atherosclerosis of aorta: Secondary | ICD-10-CM | POA: Diagnosis not present

## 2020-06-16 NOTE — Patient Instructions (Signed)
Medication Instructions:  Your physician recommends that you continue on your current medications as directed. Please refer to the Current Medication list given to you today.  *If you need a refill on your cardiac medications before your next appointment, please call your pharmacy*   Lab Work: NONE If you have labs (blood work) drawn today and your tests are completely normal, you will receive your results only by: . MyChart Message (if you have MyChart) OR . A paper copy in the mail If you have any lab test that is abnormal or we need to change your treatment, we will call you to review the results.   Testing/Procedures: NONE   Follow-Up: Follow up as needed At [facility], you and your health needs are our priority.  As part of our continuing mission to provide you with exceptional heart care, we have created designated Provider Care Teams.  These Care Teams include your primary Cardiologist (physician) and Advanced Practice Providers (APPs -  Physician Assistants and Nurse Practitioners) who all work together to provide you with the care you need, when you need it.  We recommend signing up for the patient portal called "MyChart".  Sign up information is provided on this After Visit Summary.  MyChart is used to connect with patients for Virtual Visits (Telemedicine).  Patients are able to view lab/test results, encounter notes, upcoming appointments, etc.  Non-urgent messages can be sent to your provider as well.   To learn more about what you can do with MyChart, go to [email or web address].    

## 2020-06-16 NOTE — Progress Notes (Signed)
Cardiology Office Note:    Date:  06/16/2020   ID:  Julie Stone, DOB 07/02/1965, MRN [id number]  PCP:  Lupita RaiderShaw, [name], MD  [facility] Cardiologist:  Riley LamMahesh Briani Maul MD [facility] Electrophysiologist:  None   CC: Follow up Chest Pain  History of Present Illness:    Julie Stone is a 55 y.o. 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  In interim of this visit, patient CCTA notable for aortic atherosclerosis and mild left atrial dilation.  Had echo with diastolic dysfunction.  Patient notes that she is doing Ok.  Since last visit notes no changes in chest pain.  Relevant interval testing or therapy include CCTA; after long discussion with patient she thinks that the 04/01/20 echocardiogram was hers.  There are no interval hospital/ED visit.    Tender chest pain with palpitations occasional with exertion.  Notes DOE from when she was able to swim and weight 130 lbs.  Notes that for her complex migraine she takes 5 g of salt a day. No palpitations or syncope .  Ambulatory blood pressure not done- does not want to do.   Past Medical History:  Diagnosis Date  . Carotid artery occlusion   . Depression   . GERD (gastroesophageal reflux disease)   . Heart murmur   . 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 Hx of blood clots    Pt had blood clot in right thumb  . Migraines    4/week - does have botox injections  . PONV (postoperative nausea and vomiting)     Past Surgical History:  Procedure Laterality Date  . ABDOMINAL HYSTERECTOMY    . BACK SURGERY    . CESAREAN SECTION    . CYST EXCISION Right 10/03/2018   Procedure: CYST REMOVAL;  Surgeon: Betha LoaKuzma, Kevin, MD;  Location: [facility];  Service: Orthopedics;  Laterality: Right;  . FOOT SURGERY Right   . I & D EXTREMITY Right 10/03/2018   Procedure: DEBRIDEMENT DISTAL INTERPHALANGEAL JOINT;  Surgeon: Betha LoaKuzma, Kevin, MD;  Location: [facility];  Service: Orthopedics;  Laterality: Right;  . WRIST SURGERY Right     Current Medications: Current Meds  Medication Sig  . buPROPion HCl ER, XL, 450 MG TB24 Take 450 mg by mouth every morning.   . cyclobenzaprine (FLEXERIL) 10 MG tablet Take 1 tablet (10 mg total) by mouth 2 (two) times daily as needed for muscle spasms.  . dabigatran (PRADAXA) 150 MG CAPS capsule Take 150 mg by mouth 2 (two) times daily.  Marland Kitchen. donepezil (ARICEPT) 23 MG TABS tablet Take 23 mg by mouth daily.  . Estradiol (DIVIGEL) 0.5 MG/0.5GM GEL Place 1 application onto the skin daily.  . hydrocortisone (CORTEF) 10 MG tablet Take 20 mg by mouth daily.  Marland Kitchen. ketorolac (TORADOL) 10 MG tablet Take 10 mg by mouth every 8 (eight) hours as needed for severe pain.   Marland Kitchen. levothyroxine (SYNTHROID) 75 MCG tablet Take 75 mcg by mouth daily before breakfast.  . memantine (NAMENDA) 10 MG tablet Take 15 mg by mouth daily.   . naratriptan (AMERGE) 2.5 MG tablet Take 2.5 mg by mouth as needed for migraine. Take one (1) tablet at onset of headache; if returns or does not resolve, may repeat after 4  hours; do not exceed five (5) mg in 24 hours.  Marland Kitchen PRESCRIPTION MEDICATION Place 1 application onto the skin daily. Testosterone cream 0.25 mg  . propranolol ER (INDERAL LA) 120 MG 24 hr capsule Take 120 mg by mouth daily.   . sodium chloride 1 g tablet Take 8 tablets (8 g total) by mouth daily.  Marland Kitchen tiZANidine (ZANAFLEX) 4 MG tablet Take 4 mg by mouth every 8 (eight) hours as needed for muscle spasms.   Marland Kitchen venlafaxine (EFFEXOR) 75 MG tablet Take 75 mg by mouth daily.  . Venlafaxine HCl 225 MG TB24 Take 225 mg by mouth daily.  . ziprasidone (GEODON) 20 MG capsule Take 20 mg by mouth daily.     Allergies:   Morphine,  Sulfa antibiotics, Sulfamethoxazole, Chlorhexidine gluconate, Darvon [propoxyphene hcl], Morphine and related, Triptans, Erenumab-aooe, and Propoxyphene   Social History   Socioeconomic History  . Marital status: Married    Spouse name: Not on file  . Number of children: Not on file  . Years of education: Not on file  . Highest education level: Not on file  Occupational History  . Not on file  Tobacco Use  . Smoking status: Never Smoker  . Smokeless tobacco: Never Used  Vaping Use  . Vaping Use: Never used  Substance and Sexual Activity  . Alcohol use: Yes    Alcohol/week: 2.0 standard drinks    Types: 2 Glasses of wine per week  . Drug use: No  . Sexual activity: Not on file  Other Topics Concern  . Not on file  Social History Narrative   Married   2 daughters   Exercise: weight lifting 2x weekly; some cardio   Social Determinants of Health   Financial Resource Strain: Not on file  Food Insecurity: Not on file  Transportation Needs: Not on file  Physical Activity: Not on file  Stress: Not on file  Social Connections: Not on file    Family History: The patient's family history includes Breast cancer (age of onset: 40) in an other family member; Depression in her father; Hypertension in her mother; Migraines in her father. There is no history of Other. Mother had peripheral artery disease Father had MI in early 45s  ROS:   Please see the history of present illness.    All other systems reviewed and are negative.  EKGs/Labs/Other Studies Reviewed:    The following studies were reviewed today:  EKG:   02/13/20 SR rate 79 no ST/T changes  Transthoracic Echocardiogram: Date: Results: 1. Left ventricular ejection fraction, by estimation, is 60 to 65%. The  left ventricle has normal function. The left ventricle has no regional  wall motion abnormalities. Left ventricular diastolic parameters are  consistent with Grade II diastolic  dysfunction  (pseudonormalization).  2. Right ventricular systolic function is normal. The right ventricular  size is normal. There is normal pulmonary artery systolic pressure.  3. Left atrial size was mild to moderately dilated.  4. The mitral valve is normal in structure. No evidence of mitral valve  regurgitation.  5. The aortic valve is tricuspid. There is mild calcification of the  aortic valve. Aortic valve regurgitation is not visualized. Mild aortic  valve sclerosis is present, with no evidence of aortic valve stenosis.  6. The inferior vena cava is normal in size with greater than 50%  respiratory variability, suggesting right atrial pressure of 3 mmHg.   Carotid Duplex: Date: 06/05/2017 Results: Final Interpretation:  Right Carotid: Velocities in the right ICA  are consistent with a 1-39%  stenosis.   Left Carotid: Velocities in the left ICA are consistent with a 1-39%  stenosis.   Vertebrals: Both vertebral arteries were patent with antegrade flow.  Subclavians: Normal flow hemodynamics were seen in bilateral subclavian        arteries.   Cardiac CT: Date: 03/25/20 Results: IMPRESSION: 1. Coronary calcium score of 0. This was 1st percentile for age, sex, and race matched control.  2. Normal coronary origin.  Right dominance.  3. CAD-RADS 1. Minimal non-obstructive CAD (1-24%). Consider non-atherosclerotic causes of chest pain. Consider preventive therapy and risk factor modification.  4. Mild left atrial dilation with patent left atrial appendage noted.   Recent Labs: 02/13/2020: BUN 14; Creatinine, Ser 0.85; Hemoglobin 11.5; Platelets 305; Potassium 4.3; Sodium 133  Recent Lipid Panel    Component Value Date/Time   CHOL  06/07/2008 0345    133        ATP III CLASSIFICATION:  <200     mg/dL   Desirable  275-170  mg/dL   Borderline High  >=017    mg/dL   High          TRIG 37 06/07/2008 0345   HDL 38 (L) 06/07/2008 0345   CHOLHDL 3.5 06/07/2008 0345    VLDL 7 06/07/2008 0345   LDLCALC  06/07/2008 0345    88        Total Cholesterol/HDL:CHD Risk Coronary Heart Disease Risk Table                     Men   Women  1/2 Average Risk   3.4   3.3  Average Risk       5.0   4.4  2 X Average Risk   9.6   7.1  3 X Average Risk  23.4   11.0        Use the calculated Patient Ratio above and the CHD Risk Table to determine the patient's CHD Risk.        ATP III CLASSIFICATION (LDL):  <100     mg/dL   Optimal  494-496  mg/dL   Near or Above                    Optimal  130-159  mg/dL   Borderline  759-163  mg/dL   High  >846     mg/dL   Very High   Physical Exam:    VS:  BP 124/78   Pulse (!) 52   Ht 5\' 3"  (1.6 m)   Wt 163 lb 12.8 oz (74.3 kg)   SpO2 96%   BMI 29.02 kg/m     Wt Readings from Last 3 Encounters:  06/16/20 163 lb 12.8 oz (74.3 kg)  04/03/20 160 lb (72.6 kg)  03/11/20 167 lb 6.4 oz (75.9 kg)    GEN:  Well nourished, well developed in no acute distress HEENT: Normal NECK: No JVD; No carotid bruits LYMPHATICS: No lymphadenopathy CARDIAC: RRR, II/VI systolic  Murmurs, no rubs, gallops RESPIRATORY:  Clear to auscultation without rales, wheezing or rhonchi  ABDOMEN: Soft, non-tender, non-distended MUSCULOSKELETAL:  No edema; No deformity  SKIN: Warm and dry NEUROLOGIC:  Alert and oriented x 3 PSYCHIATRIC:  Normal affect   ASSESSMENT:    1. Fibromuscular dysplasia of cervicocranial artery (HCC)   2. Aortic atherosclerosis (HCC)    PLAN:    In order of problems listed above:  Fibromuscular dysplasia -chronic - without family  history of arteriopathy - no bruits on exam - On AC per her hematologist for a hypercoagulable state; would defer ASA Brain to Pelvis Screening discussed: - Renal Artery Duplex- patient deferred - MRA Brain- patient deferred - Chest CT Angiography Chest- 02/14/20 no dissection or aortopathy; reviewed CCTA with patient  Diastolic dysfunction - discussed signs and symptoms of HFpEF;  discussed decreasing 5 g salt load.  Patient is aware and will reach to cardiology or per primary MD if new sx  Hyperlipidemia  Aortic Atherosclerosis -LDL goal less than 70 - patient deferred testing - gave education on dietary changes  Time Spent Directly with Patient:   I have spent a total of 40 minutes with the patient reviewing hospital notes, telemetry, EKGs, labs and examining the patient as well as establishing an assessment and plan that was discussed personally with the patient.  > 50% of time was spent in direct patient care- discussing whether or no the echocardiogram was hers, discussing fibromuscular dysplasia and testing, discussing HFpEF.  PRN follow up unless new symptoms or abnormal test results warranting change in plan Per patient's wishes   Medication Adjustments/Labs and Tests Ordered: Current medicines are reviewed at length with the patient today.  Concerns regarding medicines are outlined above.  No orders of the defined types were placed in this encounter.  No orders of the defined types were placed in this encounter.   Patient Instructions  Medication Instructions:  Your physician recommends that you continue on your current medications as directed. Please refer to the Current Medication list given to you today.  *If you need a refill on your cardiac medications before your next appointment, please call your pharmacy*   Lab Work: NONE If you have labs (blood work) drawn today and your tests are completely normal, you will receive your results only by: Marland Kitchen MyChart Message (if you have MyChart) OR . A paper copy in the mail If you have any lab test that is abnormal or we need to change your treatment, we will call you to review the results.   Testing/Procedures: NONE   Follow-Up: Follow up as needed At [facility], you and your health needs are our priority.  As part of our continuing mission to provide you with exceptional heart care, we have  created designated Provider Care Teams.  These Care Teams include your primary Cardiologist (physician) and Advanced Practice Providers (APPs -  Physician Assistants and Nurse Practitioners) who all work together to provide you with the care you need, when you need it.  We recommend signing up for the patient portal called "MyChart".  Sign up information is provided on this After Visit Summary.  MyChart is used to connect with patients for Virtual Visits (Telemedicine).  Patients are able to view lab/test results, encounter notes, upcoming appointments, etc.  Non-urgent messages can be sent to your provider as well.   To learn more about what you can do with MyChart, go to [email or web address].             Signed, Christell Constant, MD  06/16/2020 10:37 AM    [facility]

## 2021-02-02 ENCOUNTER — Ambulatory Visit: Admission: RE | Admit: 2021-02-02 | Payer: BLUE CROSS/BLUE SHIELD | Source: Ambulatory Visit

## 2021-02-02 ENCOUNTER — Other Ambulatory Visit: Payer: Self-pay | Admitting: Family Medicine

## 2021-02-02 ENCOUNTER — Ambulatory Visit
Admission: RE | Admit: 2021-02-02 | Discharge: 2021-02-02 | Disposition: A | Discharge status: Home / Self Care | Payer: BLUE CROSS/BLUE SHIELD | Source: Ambulatory Visit | Attending: Family Medicine | Admitting: Family Medicine

## 2021-02-02 DIAGNOSIS — N644 Mastodynia: Secondary | ICD-10-CM

## 2021-06-02 ENCOUNTER — Other Ambulatory Visit (HOSPITAL_COMMUNITY): Payer: Self-pay

## 2021-06-02 MED ORDER — DABIGATRAN ETEXILATE MESYLATE 150 MG PO CAPS
150.0000 mg | ORAL_CAPSULE | Freq: Two times a day (BID) | ORAL | 1 refills | Status: DC
  Filled 2021-06-02: qty 60, 30d supply, fill #0
  Filled 2021-06-29: qty 180, 90d supply, fill #1

## 2021-06-29 ENCOUNTER — Other Ambulatory Visit (HOSPITAL_COMMUNITY): Payer: Self-pay

## 2021-07-04 ENCOUNTER — Ambulatory Visit
Admission: RE | Admit: 2021-07-04 | Discharge: 2021-07-04 | Disposition: A | Discharge status: Home / Self Care | Payer: BC Managed Care – PPO | Source: Ambulatory Visit | Attending: Family Medicine | Admitting: Family Medicine

## 2021-07-04 ENCOUNTER — Other Ambulatory Visit: Payer: Self-pay | Admitting: Family Medicine

## 2021-07-04 DIAGNOSIS — R059 Cough, unspecified: Secondary | ICD-10-CM

## 2022-02-24 ENCOUNTER — Other Ambulatory Visit: Payer: Self-pay

## 2022-03-22 ENCOUNTER — Other Ambulatory Visit: Payer: Self-pay | Admitting: Family Medicine

## 2022-03-22 DIAGNOSIS — Z1231 Encounter for screening mammogram for malignant neoplasm of breast: Secondary | ICD-10-CM

## 2022-03-27 ENCOUNTER — Ambulatory Visit
Admission: RE | Admit: 2022-03-27 | Discharge: 2022-03-27 | Disposition: A | Discharge status: Home / Self Care | Payer: BC Managed Care – PPO | Source: Ambulatory Visit | Attending: Family Medicine | Admitting: Family Medicine

## 2022-03-27 DIAGNOSIS — Z1231 Encounter for screening mammogram for malignant neoplasm of breast: Secondary | ICD-10-CM

## 2022-03-29 ENCOUNTER — Other Ambulatory Visit: Payer: Self-pay | Admitting: Family Medicine

## 2022-03-29 DIAGNOSIS — R928 Other abnormal and inconclusive findings on diagnostic imaging of breast: Secondary | ICD-10-CM

## 2022-04-10 ENCOUNTER — Ambulatory Visit
Admission: RE | Admit: 2022-04-10 | Discharge: 2022-04-10 | Disposition: A | Discharge status: Home / Self Care | Payer: BC Managed Care – PPO | Source: Ambulatory Visit | Attending: Family Medicine | Admitting: Family Medicine

## 2022-04-10 ENCOUNTER — Other Ambulatory Visit: Payer: Self-pay | Admitting: Family Medicine

## 2022-04-10 DIAGNOSIS — R928 Other abnormal and inconclusive findings on diagnostic imaging of breast: Secondary | ICD-10-CM

## 2022-06-27 ENCOUNTER — Encounter: Payer: Self-pay | Admitting: Family Medicine

## 2022-06-27 ENCOUNTER — Other Ambulatory Visit: Payer: Self-pay | Admitting: Family Medicine

## 2022-06-27 DIAGNOSIS — N644 Mastodynia: Secondary | ICD-10-CM

## 2022-07-11 ENCOUNTER — Ambulatory Visit (INDEPENDENT_AMBULATORY_CARE_PROVIDER_SITE_OTHER): Payer: BC Managed Care – PPO

## 2022-07-11 ENCOUNTER — Ambulatory Visit (INDEPENDENT_AMBULATORY_CARE_PROVIDER_SITE_OTHER): Payer: BC Managed Care – PPO | Admitting: Podiatry

## 2022-07-11 DIAGNOSIS — M2021 Hallux rigidus, right foot: Secondary | ICD-10-CM

## 2022-07-11 DIAGNOSIS — M79671 Pain in right foot: Secondary | ICD-10-CM

## 2022-07-11 DIAGNOSIS — M7751 Other enthesopathy of right foot: Secondary | ICD-10-CM

## 2022-07-11 DIAGNOSIS — M722 Plantar fascial fibromatosis: Secondary | ICD-10-CM | POA: Diagnosis not present

## 2022-07-11 MED ORDER — TRIAMCINOLONE ACETONIDE 10 MG/ML IJ SUSP
10.0000 mg | Freq: Once | INTRAMUSCULAR | Status: AC
  Administered 2022-07-11: 10 mg

## 2022-07-11 NOTE — Progress Notes (Signed)
Subjective:   Patient ID: Kirt Boys, female   DOB: 57 y.o.   MRN: [id number]   HPI Chief Complaint  Patient presents with   Foot Problem    Patient would like to discuss getting injections in right foot   57 year old female presents the office with above concerns.  She gets pain along the big toe joint but also to the arch, medial aspect.  She has some swelling occasionally on the top of the foot.  She been on a frozen water bottle on her foot.  No injuries that she reports.  She has had plantar fasciitis previously.  Review of Systems  All other systems reviewed and are negative.  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 - Hypercoagulable state on chronic blood thinners 05/24/2015   Hx of blood clots    Pt had blood clot in right thumb   Migraines    4/week - does have botox injections   PONV (postoperative nausea and vomiting)     Past Surgical History:  Procedure Laterality Date   ABDOMINAL HYSTERECTOMY     BACK SURGERY     CESAREAN SECTION     CYST EXCISION Right 10/03/2018   Procedure: CYST REMOVAL;  Surgeon: Leanora Cover, MD;  Location: [facility];  Service: Orthopedics;  Laterality: Right;   FOOT SURGERY Right    I & D EXTREMITY Right 10/03/2018   Procedure: DEBRIDEMENT DISTAL INTERPHALANGEAL JOINT;  Surgeon: Leanora Cover, MD;  Location: [facility];  Service: Orthopedics;  Laterality: Right;   WRIST SURGERY Right      Current Outpatient Medications:    buPROPion HCl ER, XL, 450 MG TB24, Take 450 mg by mouth every morning. , Disp: , Rfl:    cyclobenzaprine (FLEXERIL) 10 MG tablet, Take 1 tablet (10 mg total) by mouth 2 (two) times daily as needed for muscle spasms., Disp: 20 tablet, Rfl: 0   dabigatran (PRADAXA) 150 MG CAPS capsule, Take 150 mg by mouth 2 (two) times daily., Disp: , Rfl:     dabigatran (PRADAXA) 150 MG CAPS capsule, Take 1 capsule (150 mg total) by mouth 2 (two) times daily., Disp: 180 capsule, Rfl: 1   donepezil (ARICEPT) 23 MG TABS tablet, Take 23 mg by mouth daily., Disp: , Rfl:    Estradiol (DIVIGEL) 0.5 MG/0.5GM GEL, Place 1 application onto the skin daily., Disp: , Rfl:    hydrocortisone (CORTEF) 10 MG tablet, Take 20 mg by mouth daily., Disp: , Rfl:    ketorolac (TORADOL) 10 MG tablet, Take 10 mg by mouth every 8 (eight) hours as needed for severe pain. , Disp: , Rfl:    levothyroxine (SYNTHROID) 75 MCG tablet, Take 75 mcg by mouth daily before breakfast., Disp: , Rfl:    memantine (NAMENDA) 10 MG tablet, Take 15 mg by mouth daily. , Disp: , Rfl:    naratriptan (AMERGE) 2.5 MG tablet, Take 2.5 mg by mouth as needed for migraine. Take one (1) tablet at onset of headache; if returns or does not resolve, may repeat after 4 hours; do not exceed five (5) mg in 24 hours., Disp: , Rfl:    PRESCRIPTION MEDICATION, Place 1 application onto the skin daily. Testosterone cream 0.25 mg, Disp: , Rfl:    propranolol ER (INDERAL LA) 120 MG 24 hr capsule, Take 120 mg by  mouth daily. , Disp: , Rfl: 3   sodium chloride 1 g tablet, Take 8 tablets (8 g total) by mouth daily., Disp: 240 tablet, Rfl: 11   tiZANidine (ZANAFLEX) 4 MG tablet, Take 4 mg by mouth every 8 (eight) hours as needed for muscle spasms. , Disp: , Rfl: 3   venlafaxine (EFFEXOR) 75 MG tablet, Take 75 mg by mouth daily., Disp: , Rfl:    Venlafaxine HCl 225 MG TB24, Take 225 mg by mouth daily., Disp: , Rfl:    ziprasidone (GEODON) 20 MG capsule, Take 20 mg by mouth daily., Disp: , Rfl:   Allergies  Allergen Reactions   Morphine Itching   Sulfa Antibiotics Swelling and Nausea And Vomiting   Sulfamethoxazole     Other reaction(s): Facial Edema (intolerance)   Chlorhexidine Gluconate Itching   Darvon [Propoxyphene Hcl] Nausea And Vomiting and Other (See Comments)    Passed out   Morphine And Related Itching    Triptans    Erenumab-Aooe Itching, Rash and Hives   Propoxyphene Nausea Only           Objective:  Physical Exam  General: AAO x3, NAD  Dermatological: Skin is warm, dry and supple bilateral.  There are no open sores, no preulcerative lesions, no rash or signs of infection present.  Vascular: Dorsalis Pedis artery and Posterior Tibial artery pedal pulses are 2/4 bilateral with immedate capillary fill time. There is no pain with calf compression, swelling, warmth, erythema.   Neruologic: Grossly intact via light touch bilateral.   Musculoskeletal: Decreased range of motion with crepitation with first MPJ range of motion on the right side.  She has tenderness along the medial aspect of the MPJ going up the foot.  She has some discomfort on the plantar medial aspect as well as some mild swelling the first interspace.  No erythema or warmth.  Gait: Unassisted, Nonantalgic.       Assessment:   Hallux rigidus, capsulitis right foot first MPJ     Plan:  -Treatment options discussed including all alternatives, risks, and complications -Etiology of symptoms were discussed -X-rays were obtained and reviewed with the patient.  3 views of right foot were obtained.  No evidence of acute fracture.  Significant arthritic changes present of the first MPJ. -We discussed the conservative as well as surgical options.  She is to hold off on surgery.  I gave her codes to check insurance for orthotic coverage.  Will likely do the hallux limitus type orthotic. -Steroid injection is formed to the first MPJ.  Skin was cleaned with alcohol and a mixture of 1 cc Kenalog 10, 0.5 cc Marcaine plain, 0.5 cc of lidocaine plain was infiltrated into and around the first MPJ without complications.  Postinjection care discussed.  Tolerated well. -Discussed stretching, icing on a regular basis.  Trula Slade DPM

## 2022-07-11 NOTE — Patient Instructions (Addendum)

## 2022-07-18 ENCOUNTER — Encounter: Payer: Self-pay | Admitting: Family Medicine

## 2022-07-19 ENCOUNTER — Encounter: Payer: Self-pay | Admitting: Family Medicine

## 2022-07-20 ENCOUNTER — Encounter: Payer: Self-pay | Admitting: Family Medicine

## 2022-07-21 ENCOUNTER — Ambulatory Visit
Admission: RE | Admit: 2022-07-21 | Discharge: 2022-07-21 | Disposition: A | Discharge status: Home / Self Care | Payer: BC Managed Care – PPO | Source: Ambulatory Visit | Attending: Family Medicine | Admitting: Family Medicine

## 2022-07-21 DIAGNOSIS — N644 Mastodynia: Secondary | ICD-10-CM

## 2022-07-21 MED ORDER — GADOPICLENOL 0.5 MMOL/ML IV SOLN
7.5000 mL | Freq: Once | INTRAVENOUS | Status: AC | PRN
  Administered 2022-07-21: 7.5 mL via INTRAVENOUS

## 2022-07-28 ENCOUNTER — Telehealth: Payer: Self-pay | Admitting: Podiatry

## 2022-07-28 ENCOUNTER — Other Ambulatory Visit: Payer: Self-pay | Admitting: Podiatry

## 2022-07-28 DIAGNOSIS — M2021 Hallux rigidus, right foot: Secondary | ICD-10-CM

## 2022-07-28 NOTE — Telephone Encounter (Signed)
Patient called and wanted to schedule an appointment for orthotic casting

## 2022-08-09 ENCOUNTER — Ambulatory Visit (INDEPENDENT_AMBULATORY_CARE_PROVIDER_SITE_OTHER): Payer: BC Managed Care – PPO

## 2022-08-09 DIAGNOSIS — M722 Plantar fascial fibromatosis: Secondary | ICD-10-CM

## 2022-08-09 DIAGNOSIS — M2021 Hallux rigidus, right foot: Secondary | ICD-10-CM

## 2022-08-09 NOTE — Progress Notes (Signed)
Patient presents today to be casted for custom molded orthotics. Ardelle Anton is the treating physician.  Impression foam cast was taken. ABN signed.  Patient info-  Shoe size: 8   Shoe style: DRESS AND ATHLETIC  Height: 5FT 3IN  Weight: 160  Insurance: [employer]   Patient will be notified once orthotics arrive in office and reappoint for fitting at that time.

## 2022-08-22 ENCOUNTER — Telehealth: Payer: Self-pay | Admitting: Podiatry

## 2022-08-22 NOTE — Telephone Encounter (Signed)
Lmom to call back to schedule picking up orthotics    Balance pending insurance  

## 2022-08-29 ENCOUNTER — Other Ambulatory Visit: Payer: BC Managed Care – PPO

## 2022-08-29 ENCOUNTER — Other Ambulatory Visit: Payer: Self-pay | Admitting: Family Medicine

## 2022-08-29 DIAGNOSIS — R229 Localized swelling, mass and lump, unspecified: Secondary | ICD-10-CM

## 2022-08-30 ENCOUNTER — Ambulatory Visit (INDEPENDENT_AMBULATORY_CARE_PROVIDER_SITE_OTHER): Payer: BC Managed Care – PPO

## 2022-08-30 DIAGNOSIS — R229 Localized swelling, mass and lump, unspecified: Secondary | ICD-10-CM | POA: Diagnosis not present

## 2022-09-11 ENCOUNTER — Other Ambulatory Visit: Payer: BC Managed Care – PPO

## 2022-09-21 ENCOUNTER — Other Ambulatory Visit: Payer: BC Managed Care – PPO

## 2022-09-21 DIAGNOSIS — M7751 Other enthesopathy of right foot: Secondary | ICD-10-CM

## 2022-09-21 DIAGNOSIS — M2021 Hallux rigidus, right foot: Secondary | ICD-10-CM

## 2022-09-21 DIAGNOSIS — M722 Plantar fascial fibromatosis: Secondary | ICD-10-CM

## 2022-10-25 ENCOUNTER — Telehealth: Payer: Self-pay | Admitting: Internal Medicine

## 2022-10-25 NOTE — Telephone Encounter (Signed)
Pt would like to do a provider switch from [facility] to [facility].

## 2022-10-30 ENCOUNTER — Other Ambulatory Visit: Payer: Self-pay | Admitting: Family Medicine

## 2022-10-30 DIAGNOSIS — H9311 Tinnitus, right ear: Secondary | ICD-10-CM

## 2022-11-07 ENCOUNTER — Ambulatory Visit
Admission: RE | Admit: 2022-11-07 | Discharge: 2022-11-07 | Disposition: A | Discharge status: Home / Self Care | Payer: BC Managed Care – PPO | Source: Ambulatory Visit | Attending: Family Medicine | Admitting: Family Medicine

## 2022-11-07 ENCOUNTER — Inpatient Hospital Stay
Admission: RE | Admit: 2022-11-07 | Discharge: 2022-11-07 | Disposition: A | Discharge status: Home / Self Care | Payer: BC Managed Care – PPO | Source: Ambulatory Visit | Attending: Family Medicine | Admitting: Family Medicine

## 2022-11-07 DIAGNOSIS — H9311 Tinnitus, right ear: Secondary | ICD-10-CM

## 2022-11-07 MED ORDER — IOPAMIDOL (ISOVUE-370) INJECTION 76%
80.0000 mL | Freq: Once | INTRAVENOUS | Status: AC | PRN
  Administered 2022-11-07: 80 mL via INTRAVENOUS

## 2023-01-08 ENCOUNTER — Encounter: Payer: Self-pay | Admitting: Cardiovascular Disease

## 2023-01-08 ENCOUNTER — Ambulatory Visit: Payer: BC Managed Care – PPO | Attending: Cardiovascular Disease | Admitting: Cardiovascular Disease

## 2023-01-08 VITALS — BP 132/84 | HR 61 | Ht 62.5 in | Wt 159.0 lb

## 2023-01-08 DIAGNOSIS — R0789 Other chest pain: Secondary | ICD-10-CM | POA: Diagnosis not present

## 2023-01-08 DIAGNOSIS — I700 Atherosclerosis of aorta: Secondary | ICD-10-CM | POA: Diagnosis not present

## 2023-01-08 DIAGNOSIS — I201 Angina pectoris with documented spasm: Secondary | ICD-10-CM

## 2023-01-08 DIAGNOSIS — R079 Chest pain, unspecified: Secondary | ICD-10-CM

## 2023-01-08 DIAGNOSIS — Z8679 Personal history of other diseases of the circulatory system: Secondary | ICD-10-CM | POA: Diagnosis not present

## 2023-01-08 DIAGNOSIS — Z1589 Genetic susceptibility to other disease: Secondary | ICD-10-CM

## 2023-01-08 DIAGNOSIS — I5189 Other ill-defined heart diseases: Secondary | ICD-10-CM

## 2023-01-08 MED ORDER — CARVEDILOL 6.25 MG PO TABS: 6.2500 mg | ORAL_TABLET | Freq: Two times a day (BID) | ORAL | 3 refills | Status: DC

## 2023-01-08 NOTE — Patient Instructions (Signed)
Medication Instructions:  Stop Propranolol  START CARVEDILOL 6.25 mg twice a day *If you need a refill on your cardiac medications before your next appointment, please call your pharmacy*   Lab Work: BMP- within a month of CT If you have labs (blood work) drawn today and your tests are completely normal, you will receive your results only by: MyChart Message (if you have MyChart) OR A paper copy in the mail If you have any lab test that is abnormal or we need to change your treatment, we will call you to review the results.   Testing/Procedures:   Your cardiac CT will be scheduled at one of the below locations:   [facility] [address] [place], [place] [address] [phone]   If scheduled at [facility], please arrive at the [facility] and Children's Entrance (Entrance C2) of [facility] 30 minutes prior to test start time. You can use the FREE valet parking offered at entrance C (encouraged to control the heart rate for the test)  Proceed to the [facility] Radiology Department (first floor) to check-in and test prep.  All radiology patients and guests should use entrance C2 at [facility], accessed from [facility], even though the hospital's physical address listed is [address].       Please follow these instructions carefully (unless otherwise directed):  An IV will be required for this test and Nitroglycerin will be given.    On the Night Before the Test: Be sure to Drink plenty of water. Do not consume any caffeinated/decaffeinated beverages or chocolate 12 hours prior to your test. Do not take any antihistamines 12 hours prior to your test.  On the Day of the Test: Drink plenty of water until 1 hour prior to the test. Do not eat any food 1 hour prior to test. You may take your regular medications prior to the test.  Take metoprolol (Lopressor) two hours prior to test. If you take  Furosemide/Hydrochlorothiazide/Spironolactone, please HOLD on the morning of the test. FEMALES- please wear underwire-free bra if available, avoid dresses & tight clothing  After the Test: Drink plenty of water. After receiving IV contrast, you may experience a mild flushed feeling. This is normal. On occasion, you may experience a mild rash up to 24 hours after the test. This is not dangerous. If this occurs, you can take Benadryl 25 mg and increase your fluid intake. If you experience trouble breathing, this can be serious. If it is severe call 911 IMMEDIATELY. If it is mild, please call our office. If you take any of these medications: Glipizide/Metformin, Avandament, Glucavance, please do not take 48 hours after completing test unless otherwise instructed.  We will call to schedule your test 2-4 weeks out understanding that some insurance companies will need an authorization prior to the service being performed.   For more information and frequently asked questions, please visit our website : [email or web address]  For non-scheduling related questions, please contact the cardiac imaging nurse navigator should you have any questions/concerns: Cardiac Imaging Nurse Navigators Direct Office Dial: [phone]   For scheduling needs, including cancellations and rescheduling, please call [place], [phone].    Follow-Up: At [facility], you and your health needs are our priority.  As part of our continuing mission to provide you with exceptional heart care, we have created designated Provider Care Teams.  These Care Teams include your primary Cardiologist (physician) and Advanced Practice Providers (APPs -  Physician Assistants  and Nurse Practitioners) who all work together to provide you with the care you need, when you need it.  We recommend signing up for the patient portal called "MyChart".  Sign up information is provided on this After Visit Summary.  MyChart is  used to connect with patients for Virtual Visits (Telemedicine).  Patients are able to view lab/test results, encounter notes, upcoming appointments, etc.  Non-urgent messages can be sent to your provider as well.   To learn more about what you can do with MyChart, go to [email or web address].    Your next appointment:   4 month(s)  Provider:   Dr Royann Shivers

## 2023-01-09 NOTE — Progress Notes (Signed)
Cardiology Office Note:    Date:  01/09/2023   ID:  Julie Stone, DOB 1966/02/19, MRN [id number]  PCP:  Lupita Raider, MD   [facility] Providers Cardiologist:  None     Referring MD: Irven Coe, MD   Chief Complaint  Patient presents with   Advice Only  Julie Stone is a 57 y.o. female who is being seen today for the evaluation of chest discomfort at the request of Irven Coe, MD.   History of Present Illness:    Julie Stone is a 57 y.o. 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  She reports a history of "bisection" of her distal left internal carotid artery.  She continues to have chest discomfort which she describes as a "elephant on my chest" in the left parasternal area with episodes of last anywhere from 10 minutes to 60 minutes.  There is no obvious trigger.  There is no relationship to activity, meals or position.  She can distinguish the chest pressure from heartburn related to GERD.  She tries to remain physically active.  She lifts weights regularly a few times a week.  She sometimes does aerobic exercises.  The most recent episode of chest discomfort happened when she was sitting on the couch watching television after dinner.  In 2021 she had a coronary CT angiogram that showed no evidence of obstructive disease in the first diagonal artery calcium score of 0.  Her echocardiogram showed normal left ventricular systolic function and was interpreted as showing grade 2 diastolic dysfunction although the E/e' ratio was indeterminant at 9-11.  The left atrium was mildly  dilated.  She does not have LVH.  The pulmonary artery pressure was normal.  She has a history of Raynaud's phenomenon of her fingers.  She has recurrent complicated migraines associated with a variety of different neurological deficits that resolve she has been taking propranolol 120 mg daily for several years now.  Together with Bennie Pierini this does a reasonable job of preventing migraines.  She is also received previous Botox injections.  She had poor results with any other medicine such as Nurtec, Vyepti, etc.  On the beta-blocker, her heart rate is typically in the 50s.  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 - Hypercoagulable state on chronic blood thinners 05/24/2015   Hx of blood clots    Pt had blood clot in right thumb   Migraines    4/week - does have botox injections   PONV (postoperative nausea and vomiting)     Past Surgical History:  Procedure Laterality Date   ABDOMINAL HYSTERECTOMY     BACK SURGERY     CESAREAN SECTION     CYST EXCISION Right 10/03/2018   Procedure: CYST REMOVAL;  Surgeon: Betha Loa, MD;  Location: [facility];  Service: Orthopedics;  Laterality: Right;   FOOT SURGERY Right    I & D EXTREMITY Right 10/03/2018   Procedure: DEBRIDEMENT DISTAL INTERPHALANGEAL JOINT;  Surgeon: Betha Loa, MD;  Location: [facility];  Service: Orthopedics;  Laterality: Right;   WRIST SURGERY Right     Current Medications: Current Meds  Medication Sig   buPROPion (WELLBUTRIN XL) 150 MG 24 hr tablet Take 150 mg by mouth daily.   buPROPion (WELLBUTRIN XL) 300 MG 24 hr tablet Take 300 mg by mouth daily.   calcium carbonate (SUPER CALCIUM) 1500 (600 Ca) MG TABS tablet Take 600 mg of elemental calcium by mouth at bedtime.   carvedilol (COREG) 6.25 MG tablet Take 1 tablet (6.25 mg total) by mouth 2 (two) times  daily.   cholecalciferol (VITAMIN D3) 25 MCG (1000 UNIT) tablet Take 1,000 Units by mouth daily.   cyclobenzaprine (FLEXERIL) 10 MG tablet Take 1 tablet (10 mg total) by mouth 2 (two) times daily as needed for muscle spasms.   dabigatran (PRADAXA) 150 MG CAPS capsule Take 150 mg by mouth 2 (two) times daily.   denosumab (PROLIA) 60 MG/ML SOSY injection Inject 60 mg into the skin every 6 (six) months.   donepezil (ARICEPT) 23 MG TABS tablet Take 23 mg by mouth daily.   Eptinezumab-jjmr (VYEPTI) 100 MG/ML injection Inject into the vein.   Estradiol 0.75 MG/1.25 GM (0.06%) topical gel Place 1.25 g onto the skin daily.   Fe Fum-FePoly-Vit C-Vit B3 (INTEGRA) 62.5-62.5-40-3 MG CAPS Take by mouth daily at 6 (six) AM.   hydrocortisone (CORTEF) 10 MG tablet Take 30 mg by mouth 2 (two) times daily. Patient takes 2 tablets in the am and 1  tablet at lunch   ketorolac (TORADOL) 10 MG tablet Take 10 mg by mouth every 8 (eight) hours as needed for severe pain.    levothyroxine (SYNTHROID) 75 MCG tablet Take 75 mcg by mouth daily before breakfast.   memantine (NAMENDA) 10 MG tablet Take 15 mg by mouth daily.    Multiple Vitamins-Minerals (MULTI COMPLETE) CAPS Take 1 capsule by mouth daily at 6 (six) AM.   naratriptan (AMERGE) 2.5 MG tablet Take 2.5 mg by mouth as needed for migraine. Take one (1) tablet at onset of headache; if returns or does not resolve, may repeat after 4 hours; do not exceed five (5) mg in 24 hours.   PRESCRIPTION MEDICATION Place 1 application onto the skin daily. Testosterone cream 0.25 mg   QULIPTA 60 MG TABS Take 1 tablet by mouth daily.   sodium chloride 1 g tablet Take 8 tablets (8 g total) by mouth daily. (Patient taking differently: Take 3-4 g by mouth daily.)   tiZANidine (ZANAFLEX) 4 MG tablet Take 4 mg by mouth every 8 (eight) hours as needed for muscle spasms.    traMADol (ULTRAM) 50 MG tablet Take 50 mg by mouth every 12 (twelve) hours as needed.   venlafaxine XR (EFFEXOR-XR)  150 MG 24 hr capsule Take 300 mg by mouth in the morning.   ziprasidone (GEODON) 20 MG capsule Take 20 mg by mouth daily.   [DISCONTINUED] propranolol ER (INDERAL LA) 120 MG 24 hr capsule Take 120 mg by mouth daily.      Allergies:   Morphine, Sulfa antibiotics, Sulfamethoxazole, Chlorhexidine gluconate, Darvon [propoxyphene hcl], Morphine and codeine, Triptans, Erenumab-aooe, and Propoxyphene   Social History   Socioeconomic History   Marital status: Married    Spouse name: Not on file   Number of children: Not on file   Years of education: Not on file   Highest education level: Not on file  Occupational History   Not on file  Tobacco Use   Smoking status: Never   Smokeless tobacco: Never  Vaping Use   Vaping status: Never Used  Substance and Sexual Activity   Alcohol use: Yes    Alcohol/week: 2.0 standard drinks of alcohol    Types: 2 Glasses of wine per week   Drug use: No   Sexual activity: Not on file  Other Topics Concern   Not on file  Social History Narrative   Married   2 daughters   Exercise: weight lifting 2x weekly; some cardio   Social Determinants of Health   Financial Resource Strain: Not on file  Food Insecurity: Not on file  Transportation Needs: Not on file  Physical Activity: Not on file  Stress: Not on file  Social Connections: Not on file     Family History: The patient's family history includes Breast cancer (age of onset: 27) in an other family member; Depression in her father; Hypertension in her mother; Migraines in her father. There is no history of Other.  ROS:   Please see the history of present illness.     All other systems reviewed and are negative.  EKGs/Labs/Other Studies Reviewed:    The following studies were reviewed today: ECHO 2021  1. Left ventricular ejection fraction, by estimation, is 60 to 65%. The  left ventricle has normal function. The left ventricle has no regional  wall motion abnormalities. Left ventricular  diastolic parameters are  consistent with Grade II diastolic  dysfunction (pseudonormalization).   2. Right ventricular systolic function is normal. The right ventricular  size is normal. There is normal pulmonary artery systolic pressure.   3. Left atrial size was mild to moderately dilated.   4. The mitral valve is normal in structure. No evidence of mitral valve  regurgitation.   5. The aortic valve is tricuspid. There is mild calcification of the  aortic valve. Aortic valve regurgitation is not visualized. Mild aortic  valve sclerosis is present, with no evidence of aortic valve stenosis.   6. The inferior vena cava is normal in size with greater than 50%  respiratory variability, suggesting right atrial pressure of 3 mmHg.   EKG Interpretation Date/Time:  Monday January 08 2023 16:09:05 EDT Ventricular Rate:  61 PR Interval:  150 QRS Duration:  96 QT Interval:  408 QTC Calculation: 410 R Axis:   1  Text Interpretation: Normal sinus rhythm Normal ECG When compared with ECG of 13-Feb-2020 22:32, No significant change was found Confirmed by Maleki Hippe [phone]) on 01/08/2023 4:32:50 PM    Recent Labs: No results found for requested labs within last 365 days.  Recent Lipid Panel    Component Value Date/Time   CHOL  06/07/2008 0345    133        ATP III CLASSIFICATION:  <200     mg/dL   Desirable  952-841  mg/dL   Borderline High  >=324    mg/dL   High          TRIG 37 06/07/2008 0345   HDL 38 (L) 06/07/2008 0345   CHOLHDL 3.5 06/07/2008 0345   VLDL 7 06/07/2008 0345   LDLCALC  06/07/2008 0345    88        Total Cholesterol/HDL:CHD Risk Coronary Heart Disease Risk Table                     Men   Women  1/2 Average Risk   3.4   3.3  Average Risk       5.0   4.4  2 X Average  Risk   9.6   7.1  3 X Average Risk  23.4   11.0        Use the calculated Patient Ratio above and the CHD Risk Table to determine the patient's CHD Risk.        ATP III CLASSIFICATION  (LDL):  <100     mg/dL   Optimal  161-096  mg/dL   Near or Above                    Optimal  130-159  mg/dL   Borderline  045-409  mg/dL   High  >811     mg/dL   Very High     Risk Assessment/Calculations:                Physical Exam:    VS:  BP 132/84 (BP Location: Left Arm, Patient Position: Sitting, Cuff Size: Normal)   Pulse 61   Ht 5' 2.5" (1.588 m)   Wt 159 lb (72.1 kg)   SpO2 96%   BMI 28.62 kg/m     Wt Readings from Last 3 Encounters:  01/08/23 159 lb (72.1 kg)  06/16/20 163 lb 12.8 oz (74.3 kg)  04/03/20 160 lb (72.6 kg)     GEN:  Well nourished, well developed in no acute distress HEENT: Normal NECK: No JVD; No carotid bruits LYMPHATICS: No lymphadenopathy CARDIAC: RRR, no murmurs, rubs, gallops RESPIRATORY:  Clear to auscultation without rales, wheezing or rhonchi  ABDOMEN: Soft, non-tender, non-distended MUSCULOSKELETAL:  No edema; No deformity  SKIN: Warm and dry NEUROLOGIC:  Alert and oriented x 3 PSYCHIATRIC:  Normal affect   ASSESSMENT:    1. Coronary artery vasospasm (HCC)   2. Chest pressure   3. History of carotid artery dissection   4. Aortic atherosclerosis (HCC)   5. Echocardiogram shows left ventricular diastolic dysfunction   6. Heterozygous MTHFR mutation C677T - Hypercoagulable state on chronic blood thinners    PLAN:    In order of problems listed above:  Chest pressure: The symptoms occur at rest and are not associate with physical activity but otherwise do have features suggestive of angina pectoris.  She has a history of Raynaud's syndrome and migraine headaches and is conceivable that she has coronary vasospasm.  The chest pain has recently become a more prominent complaint.  Will repeat a coronary CT angiogram to make sure she has not developed coronary artery dissection or obstruction.  She might benefit from switching from a pure beta-blocker to a nonselective alpha-beta-blocker: stop the propranolol and replace it with  carvedilol 6.25 mg twice daily.  Told her it is possible we may need to titrate this dose upward to obtain optimal control of symptoms and blood pressure.  Not sure what impact it will have on her migraines. History of dissection and fibromuscular dysplasia of the left carotid artery: In 2013 she had a dissection of the left internal carotid artery starting roughly 3 cm above the bifurcation complicated by a pseudoaneurysm and an area of stenosis that was at 1 point critical (90%) but has gradually improved over the years.  On her most recent duplex ultrasound from 2019 there was no evidence of extracranial carotid artery abnormality and on the most recent MRA from 07/20/2021 there was "normal appearance of the major intracranial arteries".  Of interest her most recent MRI scan from 07/20/2021 shows disproportionate age advanced volume loss of the parietal lobes with evidence of progression from the previous MRI "these findings  can be seen in Alzheimer's disease in the right clinical setting".  She has had a previous angiogram of her aorta that did not show any evidence of aneurysm or dissection branches. Aortic atherosclerosis: Incidentally noted on imaging studies, normal caliber aorta only calcium score is 0.  Most recent LDL cholesterol was a little high at 115 but she has an excellent HDL of 75.  We did discuss the fact that her target LDL cholesterol should be less than 70.  Recheck after efforts at improving diet. Echo reported diastolic dysfunction: I think I would have described her diastolic based on the echo findings.  She does have a mildly dilated left atrium.  There is no LVH.  The Doppler velocities are squarely in the "gray zone".  She does not have clinical heart failure. Hypercoagulable state: On chronic Pradaxa without bleeding complications.  Remote history of digit DVT.  MTHFR mutation.           Medication Adjustments/Labs and Tests Ordered: Current medicines are reviewed at length  with the patient today.  Concerns regarding medicines are outlined above.  Orders Placed This Encounter  Procedures   Basic metabolic panel   EKG 12-Lead   Meds ordered this encounter  Medications   carvedilol (COREG) 6.25 MG tablet    Sig: Take 1 tablet (6.25 mg total) by mouth 2 (two) times daily.    Dispense:  180 tablet    Refill:  3    Patient Instructions  Medication Instructions:  Stop Propranolol  START CARVEDILOL 6.25 mg twice a day *If you need a refill on your cardiac medications before your next appointment, please call your pharmacy*   Lab Work: BMP- within a month of CT If you have labs (blood work) drawn today and your tests are completely normal, you will receive your results only by: MyChart Message (if you have MyChart) OR A paper copy in the mail If you have any lab test that is abnormal or we need to change your treatment, we will call you to review the results.   Testing/Procedures:   Your cardiac CT will be scheduled at one of the below locations:   [facility] [address] [place], [place] [address] [phone]   If scheduled at [facility], please arrive at the [facility] and Children's Entrance (Entrance C2) of [facility] 30 minutes prior to test start time. You can use the FREE valet parking offered at entrance C (encouraged to control the heart rate for the test)  Proceed to the [facility] Radiology Department (first floor) to check-in and test prep.  All radiology patients and guests should use entrance C2 at [facility], accessed from [facility], even though the hospital's physical address listed is [address]       Please follow these instructions carefully (unless otherwise directed):  An IV will be required for this test and Nitroglycerin will be given.    On the Night Before the Test: Be sure to Drink plenty of water. Do not consume any caffeinated/decaffeinated  beverages or chocolate 12 hours prior to your test. Do not take any antihistamines 12 hours prior to your test.  On the Day of the Test: Drink plenty of water until 1 hour prior to the test. Do not eat any food 1 hour prior to test. You may take your regular medications prior to the test.  Take metoprolol (Lopressor) two hours prior to test. If you take Furosemide/Hydrochlorothiazide/Spironolactone, please  HOLD on the morning of the test. FEMALES- please wear underwire-free bra if available, avoid dresses & tight clothing  After the Test: Drink plenty of water. After receiving IV contrast, you may experience a mild flushed feeling. This is normal. On occasion, you may experience a mild rash up to 24 hours after the test. This is not dangerous. If this occurs, you can take Benadryl 25 mg and increase your fluid intake. If you experience trouble breathing, this can be serious. If it is severe call 911 IMMEDIATELY. If it is mild, please call our office. If you take any of these medications: Glipizide/Metformin, Avandament, Glucavance, please do not take 48 hours after completing test unless otherwise instructed.  We will call to schedule your test 2-4 weeks out understanding that some insurance companies will need an authorization prior to the service being performed.   For more information and frequently asked questions, please visit our website : [email or web address]  For non-scheduling related questions, please contact the cardiac imaging nurse navigator should you have any questions/concerns: Cardiac Imaging Nurse Navigators Direct Office Dial: [phone]   For scheduling needs, including cancellations and rescheduling, please call [place], [phone].    Follow-Up: At [facility], you and your health needs are our priority.  As part of our continuing mission to provide you with exceptional heart care, we have created designated Provider Care Teams.  These  Care Teams include your primary Cardiologist (physician) and Advanced Practice Providers (APPs -  Physician Assistants and Nurse Practitioners) who all work together to provide you with the care you need, when you need it.  We recommend signing up for the patient portal called "MyChart".  Sign up information is provided on this After Visit Summary.  MyChart is used to connect with patients for Virtual Visits (Telemedicine).  Patients are able to view lab/test results, encounter notes, upcoming appointments, etc.  Non-urgent messages can be sent to your provider as well.   To learn more about what you can do with MyChart, go to [email or web address].    Your next appointment:   4 month(s)  Provider:   Dr Royann Shivers    Signed, Thurmon Fair, MD  01/09/2023 12:50 PM    [facility]

## 2023-02-06 ENCOUNTER — Telehealth: Payer: Self-pay | Admitting: Cardiovascular Disease

## 2023-02-06 DIAGNOSIS — R0789 Other chest pain: Secondary | ICD-10-CM

## 2023-02-06 DIAGNOSIS — R079 Chest pain, unspecified: Secondary | ICD-10-CM

## 2023-02-06 DIAGNOSIS — R072 Precordial pain: Secondary | ICD-10-CM

## 2023-02-06 NOTE — Telephone Encounter (Signed)
Spoke with patient and she stated on last visit she was told she will get a call to get scheduled for CT.  I see the instructions on AVS. Am I over looking the order?

## 2023-02-06 NOTE — Telephone Encounter (Signed)
Patient called in to say that there is a test that Dr. Royann Shivers wanted her to do. But there is not order in system for that. Please advise

## 2023-02-07 NOTE — Telephone Encounter (Signed)
Called the patient and apologized- informed her that I did not place the order on the day of her office visit, by mistake. I put the instructions, but did not put in the order. I have placed it today. She should receive a call soon to get this scheduled. We will let her know if she needs more lab work.   She was very understanding and accepting of my apology.

## 2023-02-08 NOTE — Telephone Encounter (Signed)
Called to follow up on CTA.  Spoke with her husband (dpr) She needs a BMP prior to the CTA, which is scheduled for 02/22/23. Informed that the patient can come in next week and have this lab drawn. Please return for Blood Work within a week. No appointment needed, lab here at the office is open Monday-Friday from 8AM to 4PM. He will give her the message, pt will come in next week for lab work .

## 2023-02-22 ENCOUNTER — Ambulatory Visit (HOSPITAL_COMMUNITY): Payer: BC Managed Care – PPO

## 2023-03-08 LAB — BASIC METABOLIC PANEL
BUN/Creatinine Ratio: 14 (ref 9–23)
BUN: 12 mg/dL (ref 6–24)
CO2: 24 mmol/L (ref 20–29)
Calcium: 8.7 mg/dL (ref 8.7–10.2)
Chloride: 96 mmol/L (ref 96–106)
Creatinine, Ser: 0.85 mg/dL (ref 0.57–1.00)
Glucose: 98 mg/dL (ref 70–99)
Potassium: 5 mmol/L (ref 3.5–5.2)
Sodium: 132 mmol/L — ABNORMAL LOW (ref 134–144)
eGFR: 79 mL/min/{1.73_m2} (ref >59)

## 2023-03-16 ENCOUNTER — Telehealth (HOSPITAL_COMMUNITY): Payer: Self-pay | Admitting: *Deleted

## 2023-03-16 NOTE — Telephone Encounter (Signed)
Reaching out to patient to offer assistance regarding upcoming cardiac imaging study; pt verbalizes understanding of appt date/time, parking situation and where to check in, pre-test NPO status and medications ordered, and verified current allergies; name and call back number provided for further questions should they arise Hayley Sharpe RN Navigator Cardiac Imaging [facility] [phone] office [phone] cell  

## 2023-03-19 ENCOUNTER — Ambulatory Visit (HOSPITAL_COMMUNITY)
Admission: RE | Admit: 2023-03-19 | Discharge: 2023-03-19 | Disposition: A | Discharge status: Home / Self Care | Payer: BC Managed Care – PPO | Source: Ambulatory Visit | Attending: Cardiovascular Disease | Admitting: Cardiovascular Disease

## 2023-03-19 DIAGNOSIS — R0789 Other chest pain: Secondary | ICD-10-CM | POA: Insufficient documentation

## 2023-03-19 DIAGNOSIS — R079 Chest pain, unspecified: Secondary | ICD-10-CM | POA: Diagnosis present

## 2023-03-19 DIAGNOSIS — R072 Precordial pain: Secondary | ICD-10-CM | POA: Diagnosis present

## 2023-03-19 MED ORDER — NITROGLYCERIN 0.4 MG SL SUBL
0.8000 mg | SUBLINGUAL_TABLET | Freq: Once | SUBLINGUAL | Status: AC
  Administered 2023-03-19: 0.8 mg via SUBLINGUAL

## 2023-03-19 MED ORDER — IOHEXOL 350 MG/ML SOLN
95.0000 mL | Freq: Once | INTRAVENOUS | Status: AC | PRN
  Administered 2023-03-19: 95 mL via INTRAVENOUS

## 2023-03-19 MED ORDER — NITROGLYCERIN 0.4 MG SL SUBL
SUBLINGUAL_TABLET | SUBLINGUAL | Status: AC
  Filled 2023-03-19: qty 2

## 2023-04-03 NOTE — Progress Notes (Signed)
If she is satisfied with symptom relief currently, would re-schedule for next September.

## 2023-04-05 NOTE — Progress Notes (Signed)
No, she does not. Radiologist commented on a moderate size hiatal hernia. I recommend GO follow up.

## 2023-04-06 ENCOUNTER — Telehealth: Payer: Self-pay | Admitting: Emergency Medicine

## 2023-04-06 NOTE — Telephone Encounter (Signed)
April 05, 2023 Thurmon Fair, MD to Me     04/05/23  8:32 PM No, better to see GI Me to Thurmon Fair, MD     04/05/23  4:55 PM Pt wants to know if she still needs to come to her appt in January; since she received results, etc  Informed the patient that we can cancel the January appt; Dr Royann Shivers recommends her seeing a GI provider. She verbalized understanding. Appt cancelled

## 2023-05-11 ENCOUNTER — Ambulatory Visit: Payer: BC Managed Care – PPO | Admitting: Cardiovascular Disease

## 2023-07-12 ENCOUNTER — Other Ambulatory Visit: Payer: Self-pay | Admitting: Family Medicine

## 2023-07-12 DIAGNOSIS — R9230 Dense breasts, unspecified: Secondary | ICD-10-CM

## 2023-07-17 ENCOUNTER — Other Ambulatory Visit: Payer: Self-pay | Admitting: Family Medicine

## 2023-07-17 DIAGNOSIS — Z1231 Encounter for screening mammogram for malignant neoplasm of breast: Secondary | ICD-10-CM

## 2023-07-19 ENCOUNTER — Inpatient Hospital Stay: Admission: RE | Admit: 2023-07-19 | Source: Ambulatory Visit

## 2023-07-19 ENCOUNTER — Other Ambulatory Visit: Payer: Self-pay | Admitting: Family Medicine

## 2023-07-19 ENCOUNTER — Ambulatory Visit

## 2023-07-19 DIAGNOSIS — Z09 Encounter for follow-up examination after completed treatment for conditions other than malignant neoplasm: Secondary | ICD-10-CM

## 2023-07-19 DIAGNOSIS — N6489 Other specified disorders of breast: Secondary | ICD-10-CM

## 2023-07-24 ENCOUNTER — Ambulatory Visit
Admission: RE | Admit: 2023-07-24 | Discharge: 2023-07-24 | Disposition: A | Discharge status: Home / Self Care | Source: Ambulatory Visit | Attending: Family Medicine | Admitting: Family Medicine

## 2023-07-24 ENCOUNTER — Other Ambulatory Visit: Payer: Self-pay | Admitting: Family Medicine

## 2023-07-24 DIAGNOSIS — Z09 Encounter for follow-up examination after completed treatment for conditions other than malignant neoplasm: Secondary | ICD-10-CM

## 2023-07-24 DIAGNOSIS — N6489 Other specified disorders of breast: Secondary | ICD-10-CM

## 2023-10-08 ENCOUNTER — Telehealth: Payer: Self-pay | Admitting: Plastic Surgery

## 2023-10-08 NOTE — Telephone Encounter (Signed)
 Provider out of office called again 10-08-23 to try and r/s lvmail

## 2023-10-19 ENCOUNTER — Institutional Professional Consult (permissible substitution): Admitting: Plastic Surgery

## 2023-11-19 ENCOUNTER — Institutional Professional Consult (permissible substitution): Admitting: Plastic Surgery

## 2023-11-27 ENCOUNTER — Encounter: Payer: Self-pay | Admitting: Plastic Surgery

## 2023-11-27 ENCOUNTER — Ambulatory Visit (INDEPENDENT_AMBULATORY_CARE_PROVIDER_SITE_OTHER): Payer: Self-pay | Admitting: Plastic Surgery

## 2023-11-27 VITALS — BP 124/80 | HR 65 | Ht 63.0 in | Wt 163.0 lb

## 2023-11-27 DIAGNOSIS — Z719 Counseling, unspecified: Secondary | ICD-10-CM | POA: Insufficient documentation

## 2023-11-27 NOTE — Progress Notes (Signed)
 Patient ID: Julie Stone, female    DOB: 06-04-1965, 58 y.o.   MRN: [id number]   Chief Complaint  Patient presents with   Consult         The patient is a 58 year old female here for evaluation of her face.  She is 5 feet 3 inches tall and weighs 163 pounds.  She has multiple medical conditions listed below but primarily Addison's disease.  She does not like the way her face looks and is interested in seeing if she can get her skin to look better and also less fullness in her submental chin area.  She has been working with her primary doctors and trying to stabilize her symptoms.  She has severe fatigue particularly around lunchtime.  She is on hydrocortisone.  She has extensive sun damage to her face.  She does wash her face twice daily and uses moisturizer.    Review of Systems  Constitutional: Negative.   HENT: Negative.    Eyes: Negative.   Respiratory: Negative.    Cardiovascular: Negative.   Gastrointestinal: Negative.   Endocrine: Negative.   Genitourinary: Negative.     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 - Hypercoagulable state on chronic blood thinners 05/24/2015   Hx of blood clots    Pt had blood clot in right thumb   Migraines    4/week - does have botox injections   PONV (postoperative nausea and vomiting)     Past Surgical History:  Procedure Laterality Date   ABDOMINAL HYSTERECTOMY     BACK SURGERY     CESAREAN SECTION     CYST EXCISION Right 10/03/2018   Procedure: CYST REMOVAL;  Surgeon: Murrell Drivers, MD;  Location: [facility];  Service: Orthopedics;  Laterality: Right;   FOOT SURGERY Right    I & D EXTREMITY Right 10/03/2018   Procedure: DEBRIDEMENT DISTAL INTERPHALANGEAL JOINT;  Surgeon: Murrell Drivers, MD;  Location: [facility];  Service: Orthopedics;  Laterality: Right;   WRIST  SURGERY Right       Current Outpatient Medications:    buPROPion  (WELLBUTRIN  XL) 150 MG 24 hr tablet, Take 150 mg by mouth daily., Disp: , Rfl:    buPROPion  (WELLBUTRIN  XL) 300 MG 24 hr tablet, Take 300 mg by mouth daily., Disp: , Rfl:    calcium carbonate (SUPER CALCIUM) 1500 (600 Ca) MG TABS tablet, Take 600 mg of elemental calcium by mouth at bedtime., Disp: , Rfl:    carvedilol  (COREG ) 6.25 MG tablet, Take 1 tablet (6.25 mg total) by mouth 2 (two) times daily., Disp: 180 tablet, Rfl: 3   cholecalciferol (VITAMIN D3) 25 MCG (1000 UNIT) tablet, Take 1,000 Units by mouth daily., Disp: , Rfl:    cyclobenzaprine  (FLEXERIL ) 10 MG tablet, Take 1 tablet (10 mg total) by mouth 2 (two) times daily as needed for muscle spasms., Disp: 20 tablet, Rfl: 0   dabigatran  (PRADAXA ) 150 MG CAPS capsule, Take 150 mg by mouth 2 (two) times daily., Disp: , Rfl:    denosumab (PROLIA) 60 MG/ML SOSY injection, Inject 60 mg into the skin every 6 (six) months., Disp: , Rfl:    donepezil  (ARICEPT ) 23 MG TABS tablet, Take 23 mg by mouth daily., Disp: , Rfl:    Estradiol 0.75 MG/1.25 GM (0.06%) topical gel, Place 1.25 g onto  the skin daily., Disp: , Rfl:    hydrocortisone (CORTEF) 10 MG tablet, Take 30 mg by mouth 2 (two) times daily. Patient takes 2 tablets in the am and 1  tablet at lunch, Disp: , Rfl:    ketorolac  (TORADOL ) 10 MG tablet, Take 10 mg by mouth every 8 (eight) hours as needed for severe pain. , Disp: , Rfl:    levothyroxine (SYNTHROID) 75 MCG tablet, Take 75 mcg by mouth daily before breakfast., Disp: , Rfl:    memantine (NAMENDA) 10 MG tablet, Take 15 mg by mouth daily. , Disp: , Rfl:    Multiple Vitamins-Minerals (MULTI COMPLETE) CAPS, Take 1 capsule by mouth daily at 6 (six) AM., Disp: , Rfl:    NURTEC 75 MG TBDP, Place 75 mg under the tongue as needed., Disp: , Rfl:    PRESCRIPTION MEDICATION, Place 1 application onto the skin daily. Testosterone cream 0.25 mg, Disp: , Rfl:    QULIPTA 60 MG TABS, Take  1 tablet by mouth daily., Disp: , Rfl:    sodium chloride  1 g tablet, Take 8 tablets (8 g total) by mouth daily. (Patient taking differently: Take 3-4 g by mouth daily.), Disp: 240 tablet, Rfl: 11   tiZANidine (ZANAFLEX) 4 MG tablet, Take 4 mg by mouth every 8 (eight) hours as needed for muscle spasms. , Disp: , Rfl: 3   traMADol (ULTRAM) 50 MG tablet, Take 50 mg by mouth every 12 (twelve) hours as needed., Disp: , Rfl:    venlafaxine  XR (EFFEXOR -XR) 150 MG 24 hr capsule, Take 300 mg by mouth in the morning., Disp: , Rfl:    Fe Fum-FePoly-Vit C-Vit B3 (INTEGRA) 62.5-62.5-40-3 MG CAPS, Take by mouth daily at 6 (six) AM. (Patient not taking: Reported on 11/27/2023), Disp: , Rfl:    naratriptan (AMERGE) 2.5 MG tablet, Take 2.5 mg by mouth as needed for migraine. Take one (1) tablet at onset of headache; if returns or does not resolve, may repeat after 4 hours; do not exceed five (5) mg in 24 hours. (Patient not taking: Reported on 11/27/2023), Disp: , Rfl:    ziprasidone (GEODON) 20 MG capsule, Take 20 mg by mouth daily. (Patient not taking: Reported on 11/27/2023), Disp: , Rfl:    Objective:   Vitals:   11/27/23 0822  BP: 124/80  Pulse: 65  SpO2: 96%    Physical Exam Constitutional:      Appearance: Normal appearance.  HENT:     Head: Atraumatic.  Cardiovascular:     Rate and Rhythm: Normal rate.     Pulses: Normal pulses.  Pulmonary:     Effort: Pulmonary effort is normal.  Skin:    General: Skin is warm.     Capillary Refill: Capillary refill takes less than 2 seconds.  Neurological:     Mental Status: She is alert and oriented to person, place, and time.  Psychiatric:        Mood and Affect: Mood normal.        Behavior: Behavior normal.        Thought Content: Thought content normal.        Judgment: Judgment normal.     Assessment & Plan:  Encounter for counseling  The patient is a good candidate for the broadband light laser as well as Kybella.  I will send her information  about that as well as the halo.  She could also do some Botox on the lateral periorbital area.  Pictures were obtained of the patient and  placed in the chart with the patient's or guardian's permission.  Julie RAMAN Lilas Diefendorf, DO

## 2023-11-27 NOTE — Addendum Note (Signed)
 Addended by: DEVONDA ISAIAH SAILOR on: 11/27/2023 09:03 AM   Modules accepted: Orders

## 2023-12-05 ENCOUNTER — Other Ambulatory Visit: Payer: Self-pay | Admitting: Orthopedic Surgery

## 2024-01-01 ENCOUNTER — Other Ambulatory Visit: Payer: Self-pay

## 2024-01-03 MED ORDER — CARVEDILOL 6.25 MG PO TABS: 6.2500 mg | ORAL_TABLET | Freq: Two times a day (BID) | ORAL | 0 refills | Status: DC

## 2024-01-24 ENCOUNTER — Telehealth: Payer: Self-pay | Admitting: Plastic Surgery

## 2024-01-24 NOTE — Telephone Encounter (Signed)
 provider out of office called and lvmail 01-24-24 to r/s

## 2024-02-12 ENCOUNTER — Other Ambulatory Visit: Payer: Self-pay | Admitting: Plastic Surgery

## 2024-02-12 ENCOUNTER — Telehealth: Payer: Self-pay | Admitting: Plastic Surgery

## 2024-02-12 NOTE — Telephone Encounter (Signed)
 called provider out of office called 02-12-24

## 2024-02-19 ENCOUNTER — Ambulatory Visit (INDEPENDENT_AMBULATORY_CARE_PROVIDER_SITE_OTHER): Payer: Self-pay | Admitting: Plastic Surgery

## 2024-02-19 DIAGNOSIS — Z719 Counseling, unspecified: Secondary | ICD-10-CM

## 2024-02-19 NOTE — Progress Notes (Signed)
 Preoperative Dx: Hyperpigmented face  Postoperative Dx:  same  Procedure: laser to face  Anesthesia: none  Description of Procedure:  Risks and complications were explained to the patient. Consent was confirmed and signed. Eye protection was placed. Time out was called and all information was confirmed to be correct. The area  area was prepped with alcohol and wiped dry. The Heroic laser was set at 532 nm  and 515 nm at preset J/cm2. The face was lasered. The patient tolerated the procedure well and there were no complications. The patient is to follow up in 4 weeks.

## 2024-02-28 ENCOUNTER — Encounter (HOSPITAL_BASED_OUTPATIENT_CLINIC_OR_DEPARTMENT_OTHER): Payer: Self-pay

## 2024-02-28 ENCOUNTER — Ambulatory Visit (HOSPITAL_BASED_OUTPATIENT_CLINIC_OR_DEPARTMENT_OTHER): Admit: 2024-02-28 | Admitting: Orthopedic Surgery

## 2024-02-28 SURGERY — CARPOMETACARPEL (CMC) SUSPENSION PLASTY
Anesthesia: Monitor Anesthesia Care | Site: Thumb | Laterality: Left

## 2024-03-14 ENCOUNTER — Other Ambulatory Visit: Admitting: Plastic Surgery

## 2024-03-18 ENCOUNTER — Ambulatory Visit (INDEPENDENT_AMBULATORY_CARE_PROVIDER_SITE_OTHER): Payer: Self-pay | Admitting: Plastic Surgery

## 2024-03-18 DIAGNOSIS — Z719 Counseling, unspecified: Secondary | ICD-10-CM

## 2024-03-18 NOTE — Progress Notes (Signed)
 Preoperative Dx: Hyperpigmentation of face and neck  Postoperative Dx:  same  Procedure: laser to face  Anesthesia: none  Description of Procedure:  Risks and complications were explained to the patient. Consent was confirmed and signed. Eye protection was placed. Time out was called and all information was confirmed to be correct. The area  area was prepped with alcohol and wiped dry. The heroic laser was set at 532 nm and preset joules. The face and neck were lasered. The patient tolerated the procedure well and there were no complications. The patient is to follow up in 4 weeks.

## 2024-03-29 ENCOUNTER — Other Ambulatory Visit: Payer: Self-pay | Admitting: Cardiovascular Disease

## 2024-04-22 ENCOUNTER — Ambulatory Visit (INDEPENDENT_AMBULATORY_CARE_PROVIDER_SITE_OTHER): Payer: Self-pay | Admitting: Plastic Surgery

## 2024-04-22 ENCOUNTER — Encounter: Payer: Self-pay | Admitting: Plastic Surgery

## 2024-04-22 VITALS — BP 143/85 | HR 66 | Ht 63.0 in | Wt 162.2 lb

## 2024-04-22 DIAGNOSIS — Z719 Counseling, unspecified: Secondary | ICD-10-CM

## 2024-04-22 NOTE — Progress Notes (Signed)
 Preoperative Dx: Hyperpigmentation of face, chest and arms  Postoperative Dx:  same  Procedure: laser to face, neck and arms  Anesthesia: none  Description of Procedure:  Risks and complications were explained to the patient. Consent was confirmed and signed. Eye protection was placed. Time out was called and all information was confirmed to be correct. The area  area was prepped with alcohol and wiped dry. The aerobic laser was set at 532 J/cm2. The face, neck and arms were lasered. The patient tolerated the procedure well and there were no complications. The patient is to follow up in 4 weeks.  Kybella Procedure Note  Procedure: Cosmetic deoxycholic acid injection (2 vials were used)  Pre-operative Diagnosis: Submental fullness  Post-operative Diagnosis: Same  Complications:  None  Brief history: The patient desires improvement in the appearance of moderate to severe convexity or fullness associated with submental fat. I discussed with the patient this proposed procedure of Kybella, which is customized depending on the particular needs of the patient. It is performed on the submental area for reduction in the fat.  The alternatives were discussed with the patient. The risks were addressed including bleeding, scarring, infection, damage to deeper structures, asymmetry, numbness, formation of areas of hardness, swelling, nodules, skin ulceration, headache, alopecia, difficulty swallowing, and muscle weakness. Additionally, marginal mandibular nerve injury could occur and is manifested as an asymmetric smile or facial muscle weakness.  The individual's choice to undergo a surgical procedure is based on the comparison of risks to potential benefits. Injections do not arrest the aging process or produce permanent tightening of the skin.  Operative intervention maybe necessary to maintain the results. The patient understands and wishes to proceed. An informed consent was signed and informational  brochures given to her prior to the procedure.  Procedure: The area was prepped with alcohol and dried with a clean gauze. Using a clean technique, the submental area was palpated and pinched.  The skin at the area was pulled and there was appropriate elasticity noted without excessive skin laxity. The patient was asked to tense the platysma to define the subcutaneous fat between the dermis and platysma.  A skin marker was used to mark the anterior, posterior and lateral borders of the submental fat compartment. The no treatment Zone was marked. The treatment zone was injected in the subcutaneous layer with lidocaine  1% with epinepherine. The grid was placed on the skin with the water over it for activation of the ink.  The clear protective sheet was removed leaving the markings.  The dots outside the previously marked treatment zone were removed with an alcohol wipe.  The pre-platysmal fat was pinched with 2 fingers.  Each dot was injected perpendicular to the skin with .2 cc of Kybella using a 1 ml syring and a 30 gauge needle. The ink was wiped off the skin with an alcohol wipe and a cold pack was applied to the treatment area. No complications were noted. Light pressure with an ice pack was held for 5 minutes. She was instructed explicitly in post-operative care including no massage, heavy activity, work out or facial for 24 hours.  Deoxycholic Acid LOT:  [id number]

## 2024-05-06 ENCOUNTER — Encounter: Payer: Self-pay | Admitting: Plastic Surgery

## 2024-06-10 ENCOUNTER — Encounter: Payer: Self-pay | Admitting: Plastic Surgery

## 2024-07-15 ENCOUNTER — Encounter: Payer: Self-pay | Admitting: Plastic Surgery
